# Patient Record
Sex: Female | Born: 1960 | Race: White | Hispanic: No | Marital: Married | Smoking: Never smoker
Health system: Southern US, Community
[De-identification: ages and names within clinical notes are randomized; demographics above are authoritative.]

## PROBLEM LIST (undated history)

## (undated) DIAGNOSIS — R200 Anesthesia of skin: Secondary | ICD-10-CM

## (undated) DIAGNOSIS — H539 Unspecified visual disturbance: Secondary | ICD-10-CM

## (undated) DIAGNOSIS — J45909 Unspecified asthma, uncomplicated: Secondary | ICD-10-CM

## (undated) DIAGNOSIS — M1990 Unspecified osteoarthritis, unspecified site: Secondary | ICD-10-CM

## (undated) DIAGNOSIS — F419 Anxiety disorder, unspecified: Secondary | ICD-10-CM

## (undated) DIAGNOSIS — M791 Myalgia, unspecified site: Secondary | ICD-10-CM

## (undated) DIAGNOSIS — R42 Dizziness and giddiness: Secondary | ICD-10-CM

## (undated) DIAGNOSIS — R413 Other amnesia: Secondary | ICD-10-CM

## (undated) DIAGNOSIS — M797 Fibromyalgia: Secondary | ICD-10-CM

## (undated) DIAGNOSIS — Z8619 Personal history of other infectious and parasitic diseases: Secondary | ICD-10-CM

## (undated) DIAGNOSIS — R202 Paresthesia of skin: Secondary | ICD-10-CM

## (undated) HISTORY — PX: HIP ARTHROPLASTY: SHX981

## (undated) HISTORY — DX: Unspecified osteoarthritis, unspecified site: M19.90

## (undated) HISTORY — DX: Other amnesia: R41.3

## (undated) HISTORY — DX: Dizziness and giddiness: R42

## (undated) HISTORY — DX: Anxiety disorder, unspecified: F41.9

## (undated) HISTORY — DX: Anesthesia of skin: R20.0

## (undated) HISTORY — DX: Personal history of other infectious and parasitic diseases: Z86.19

## (undated) HISTORY — DX: Fibromyalgia: M79.7

## (undated) HISTORY — DX: Unspecified visual disturbance: H53.9

## (undated) HISTORY — DX: Paresthesia of skin: R20.2

## (undated) HISTORY — DX: Unspecified asthma, uncomplicated: J45.909

## (undated) HISTORY — PX: ABDOMINAL HYSTERECTOMY: SHX81

## (undated) HISTORY — DX: Myalgia, unspecified site: M79.10

---

## 2005-04-25 ENCOUNTER — Ambulatory Visit: Payer: Self-pay | Admitting: Obstetrics and Gynecology

## 2006-05-18 ENCOUNTER — Ambulatory Visit: Payer: Self-pay | Admitting: Obstetrics and Gynecology

## 2006-11-16 ENCOUNTER — Ambulatory Visit: Payer: Self-pay | Admitting: Gastroenterology

## 2007-01-18 ENCOUNTER — Ambulatory Visit: Payer: Self-pay | Admitting: Rheumatology

## 2007-05-21 ENCOUNTER — Ambulatory Visit: Payer: Self-pay | Admitting: Obstetrics and Gynecology

## 2008-05-22 ENCOUNTER — Ambulatory Visit: Payer: Self-pay | Admitting: Obstetrics and Gynecology

## 2009-04-30 ENCOUNTER — Ambulatory Visit: Payer: Self-pay | Admitting: Family Medicine

## 2009-05-25 ENCOUNTER — Ambulatory Visit: Payer: Self-pay | Admitting: Obstetrics and Gynecology

## 2010-05-27 ENCOUNTER — Ambulatory Visit: Payer: Self-pay | Admitting: Obstetrics and Gynecology

## 2010-06-02 ENCOUNTER — Ambulatory Visit: Payer: Self-pay | Admitting: Obstetrics and Gynecology

## 2011-05-30 ENCOUNTER — Ambulatory Visit: Payer: Self-pay | Admitting: Obstetrics and Gynecology

## 2012-01-17 ENCOUNTER — Ambulatory Visit: Payer: Self-pay | Admitting: Family Medicine

## 2012-05-18 ENCOUNTER — Ambulatory Visit: Payer: Self-pay | Admitting: Gastroenterology

## 2012-06-01 ENCOUNTER — Ambulatory Visit: Payer: Self-pay | Admitting: Obstetrics and Gynecology

## 2012-06-18 ENCOUNTER — Ambulatory Visit: Payer: Self-pay | Admitting: Obstetrics and Gynecology

## 2012-10-24 ENCOUNTER — Ambulatory Visit: Payer: Self-pay | Admitting: Orthopedic Surgery

## 2012-11-05 ENCOUNTER — Ambulatory Visit: Payer: Self-pay | Admitting: Otolaryngology

## 2012-12-18 ENCOUNTER — Ambulatory Visit: Payer: Self-pay | Admitting: Obstetrics and Gynecology

## 2013-06-13 ENCOUNTER — Ambulatory Visit: Payer: Self-pay | Admitting: Obstetrics and Gynecology

## 2014-06-06 ENCOUNTER — Ambulatory Visit: Payer: Self-pay | Admitting: Orthopedic Surgery

## 2014-06-26 ENCOUNTER — Ambulatory Visit: Payer: Self-pay | Admitting: Obstetrics and Gynecology

## 2014-07-11 HISTORY — PX: COLONOSCOPY: SHX174

## 2014-10-17 ENCOUNTER — Ambulatory Visit
Admit: 2014-10-17 | Disposition: A | Discharge status: Home / Self Care | Payer: Self-pay | Attending: Family Medicine | Admitting: Family Medicine

## 2015-06-09 ENCOUNTER — Other Ambulatory Visit: Payer: Self-pay | Admitting: Obstetrics and Gynecology

## 2015-06-09 DIAGNOSIS — N644 Mastodynia: Secondary | ICD-10-CM

## 2015-06-24 ENCOUNTER — Other Ambulatory Visit: Payer: Self-pay

## 2015-06-24 ENCOUNTER — Ambulatory Visit: Payer: Self-pay

## 2016-02-28 IMAGING — MR MRI LUMBAR SPINE WITHOUT AND WITH CONTRAST
4 of 7 series · 13 of 48 positions shown · IV contrast (multihance)
Comparison: None.

CLINICAL DATA: Low back pain with pain and numbness in the legs.

EXAM:
MRI LUMBAR SPINE WITHOUT AND WITH CONTRAST
TECHNIQUE: Multiplanar and multiecho pulse sequences of the lumbar spine were
obtained without and with intravenous contrast.
CONTRAST:  15 cc MultiHance

[Series 2: T2 · sagittal · 4.0mm · 0.36mm/px · 4 of 16 slices shown (1 of 2)]
[im 1/16]
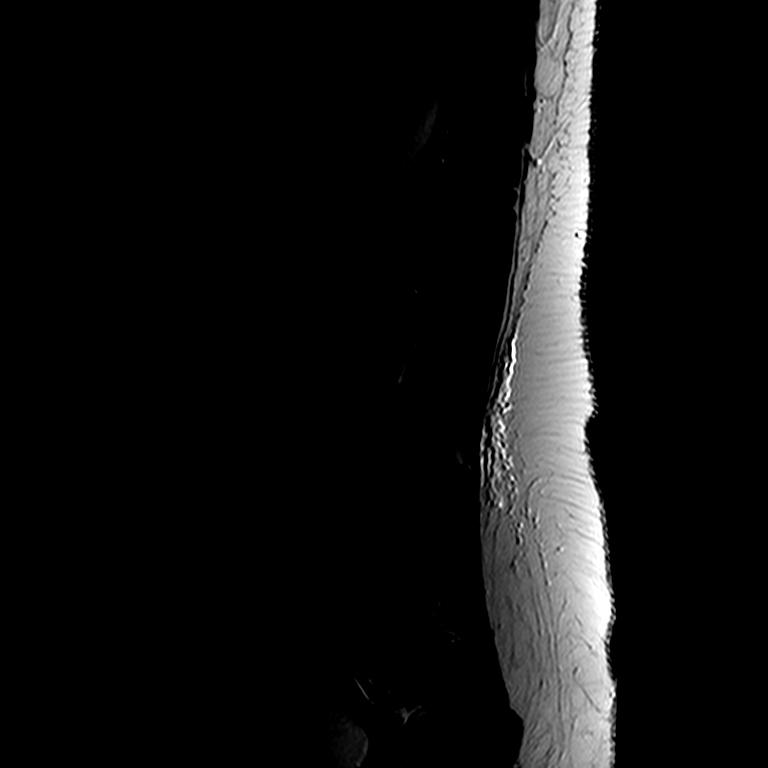
[im 4/16]
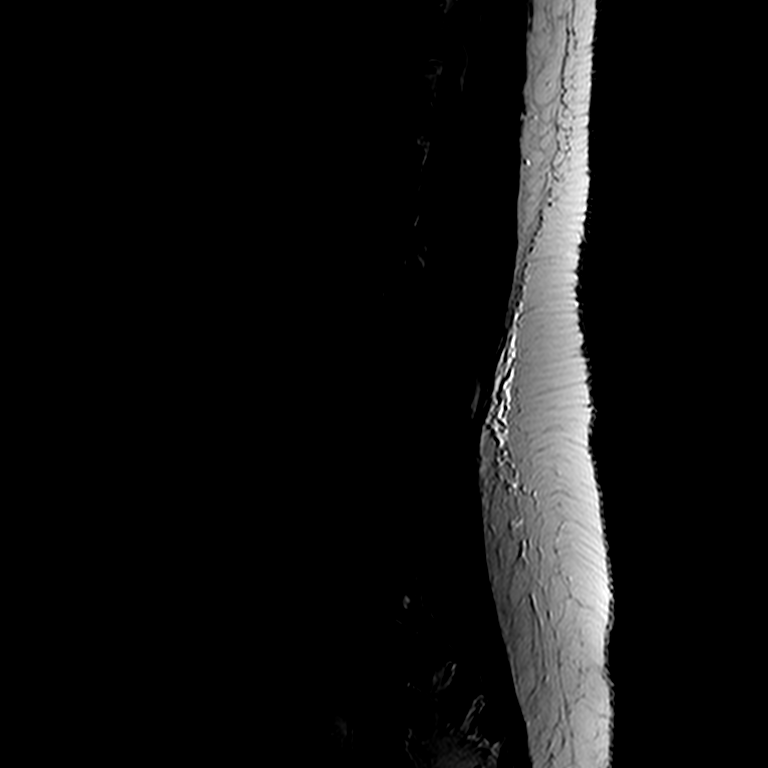
[im 8/16]
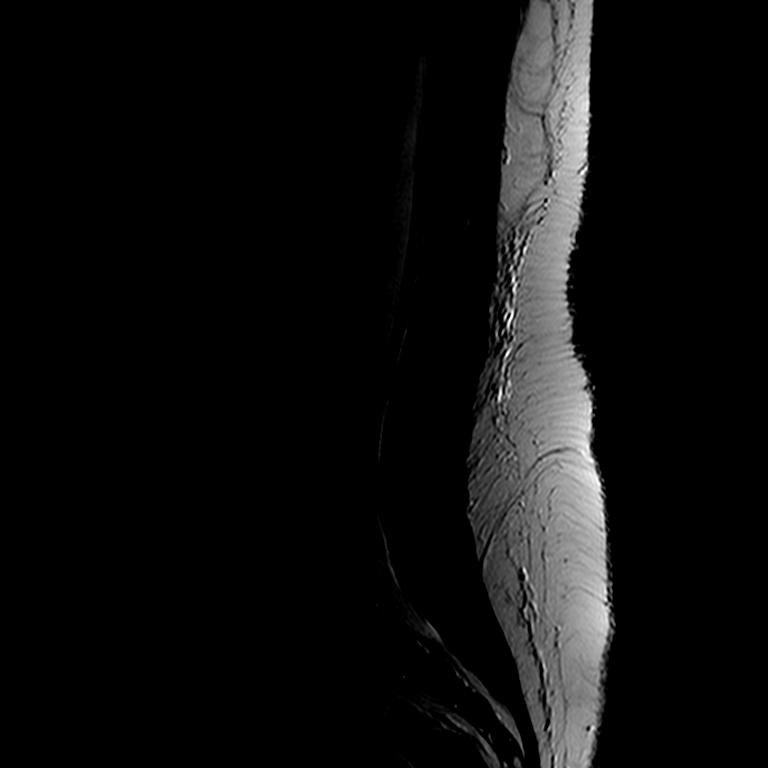
[im 16/16]
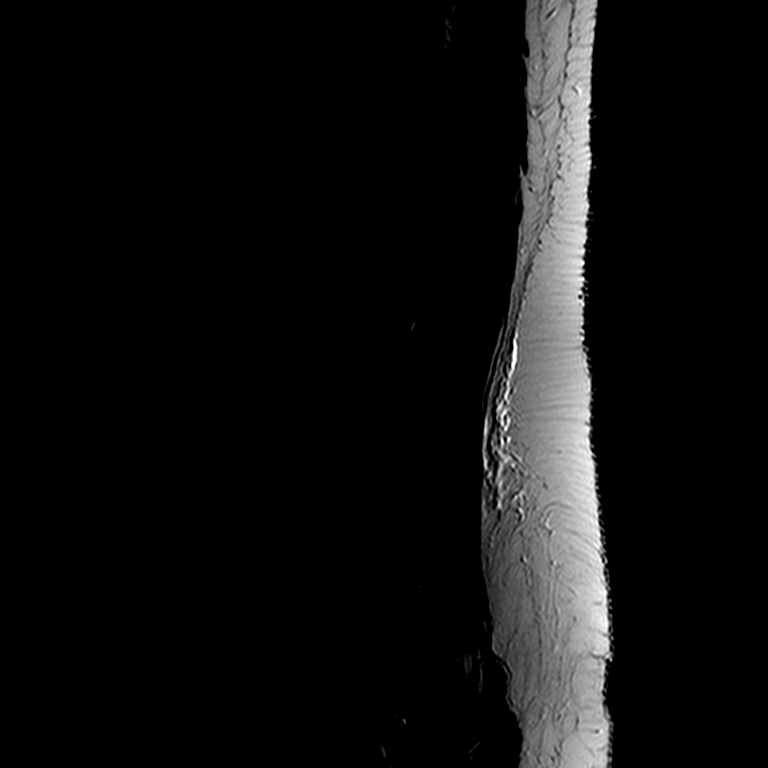

[Series 3: T1 · sagittal · 4.0mm · 0.44mm/px · 3 of 16 slices shown (1 of 2)]
[im 1/16]
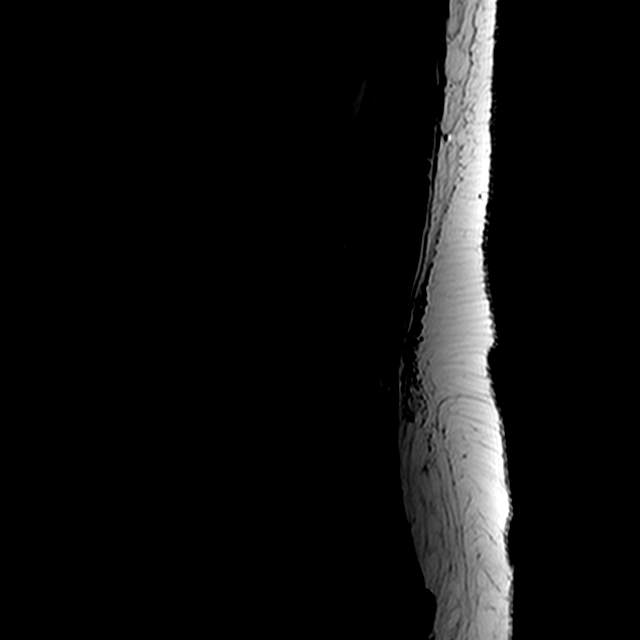
[im 8/16]
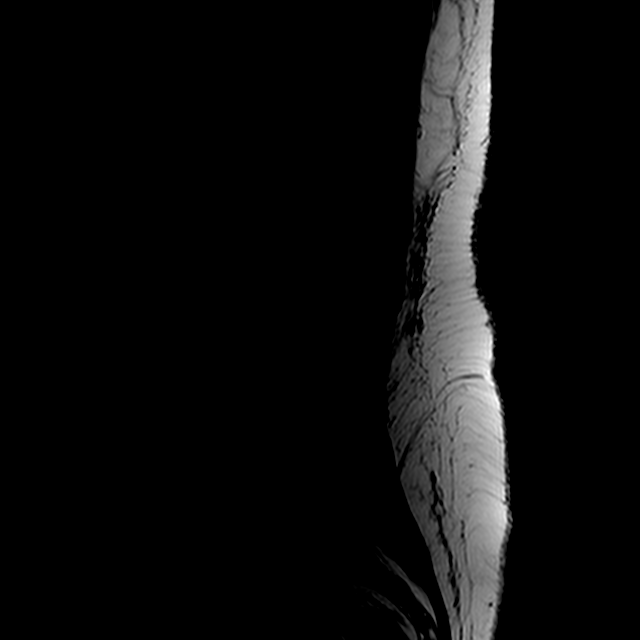
[im 16/16]
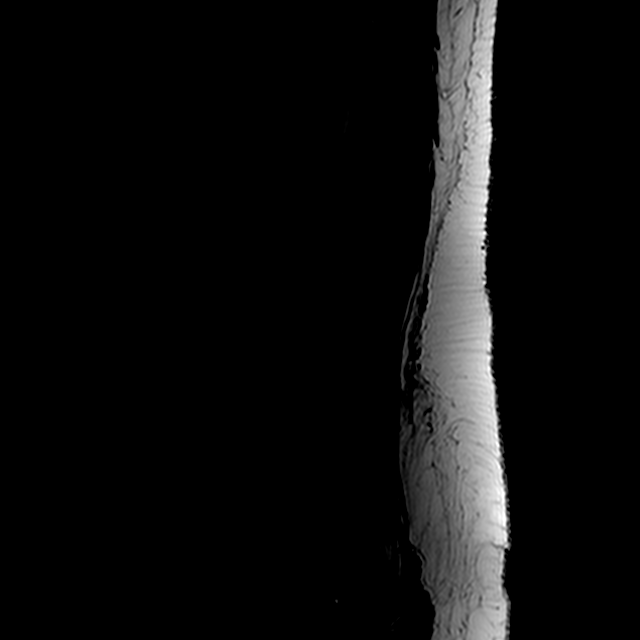

[Series 5: T2 · axial · 4.0mm · 0.39mm/px · z∈[-110,+54]mm · 3 of 37 slices shown (2 of 2)]
[im 5/37]
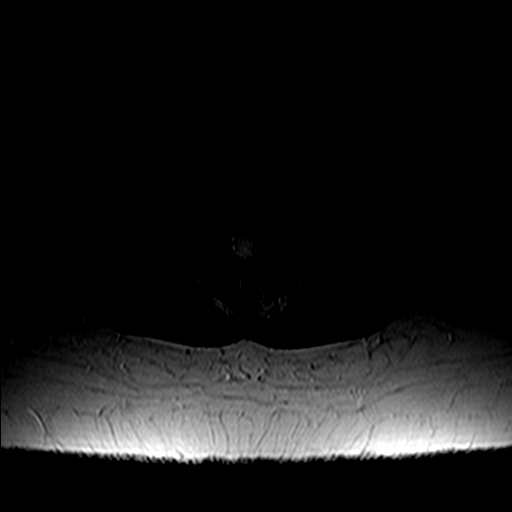
[im 21/37]
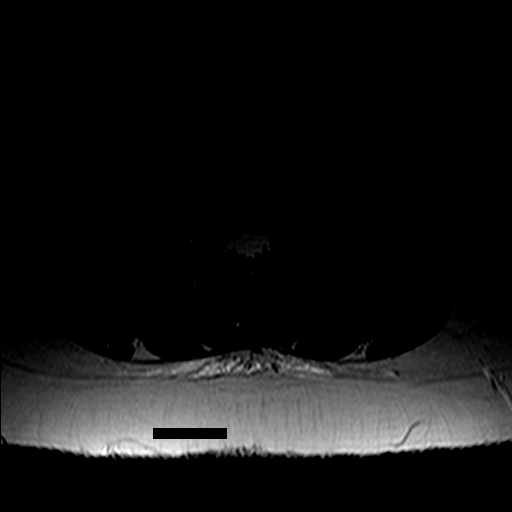
[im 33/37]
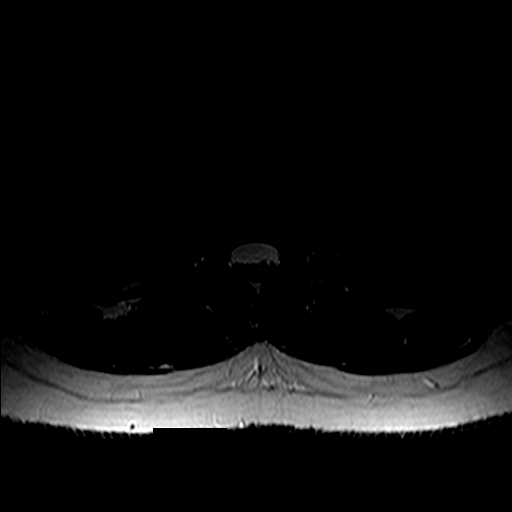

[Series 6: T1 · axial · 4.0mm · 0.39mm/px · z∈[-110,+54]mm · 3 of 37 slices shown (2 of 2)]
[im 5/37]
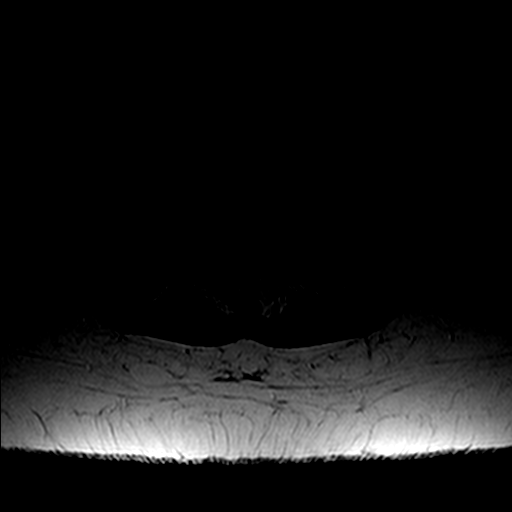
[im 21/37]
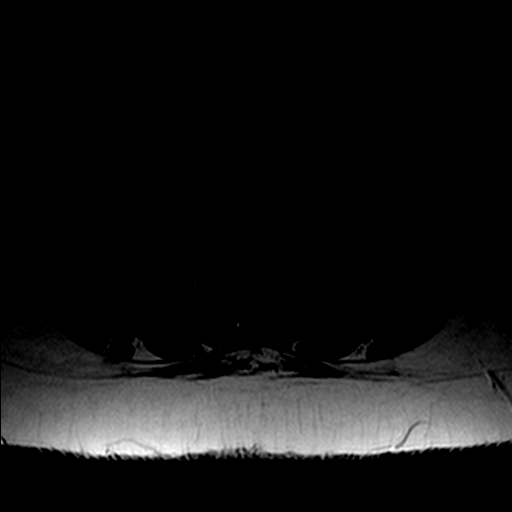
[im 33/37]
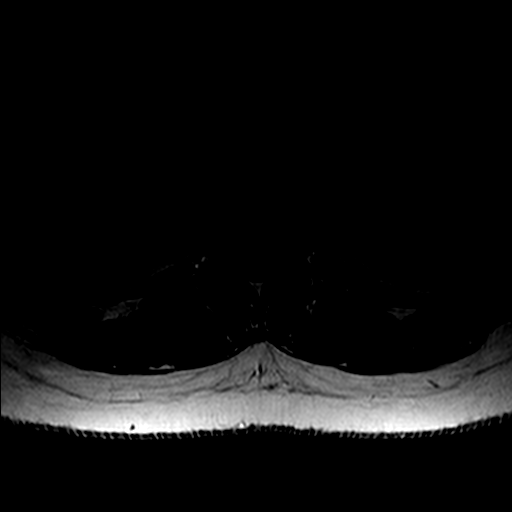

[13 of 48 positions shown; findings below may reference images not displayed]

FINDINGS: Normal conus tip at T12-L1. Paraspinal soft tissues are normal.
There is a 3.9 cm simple appearing cyst on the right ovary as well
as several small follicles.

The T11-12 and T12-L1:  Normal.

L1-2 and L2-3: Tiny broad-based disc bulges with no neural
impingement.

L3-4: Normal disc. Minimal degenerative changes of the right facet
joint.

L4-5: Normal disc. Moderate bilateral facet arthritis with
inflammation in and around both joints. The soft tissues enhance
around both facet joints after contrast administration.

L5-S1: Tiny annular fissure to the right of midline. Otherwise,
normal.
IMPRESSION: Inflammatory arthropathy of both facet joints at L4-5. Otherwise,
essentially normal exam.

## 2016-04-20 ENCOUNTER — Other Ambulatory Visit: Payer: Self-pay | Admitting: Orthopedic Surgery

## 2016-04-20 DIAGNOSIS — S63501D Unspecified sprain of right wrist, subsequent encounter: Secondary | ICD-10-CM

## 2016-05-03 ENCOUNTER — Ambulatory Visit
Admission: RE | Admit: 2016-05-03 | Discharge: 2016-05-03 | Disposition: A | Discharge status: Home / Self Care | Payer: BLUE CROSS/BLUE SHIELD | Source: Ambulatory Visit | Attending: Orthopedic Surgery | Admitting: Orthopedic Surgery

## 2016-05-03 ENCOUNTER — Ambulatory Visit: Payer: Self-pay

## 2016-05-03 DIAGNOSIS — S63501D Unspecified sprain of right wrist, subsequent encounter: Secondary | ICD-10-CM

## 2016-12-14 ENCOUNTER — Encounter: Payer: Self-pay | Admitting: Hematology and Oncology

## 2016-12-14 ENCOUNTER — Inpatient Hospital Stay: Payer: BLUE CROSS/BLUE SHIELD | Attending: Hematology and Oncology | Admitting: Hematology and Oncology

## 2016-12-14 VITALS — BP 132/73 | HR 72 | Temp 98.2°F | Resp 18 | Wt 182.3 lb

## 2016-12-14 DIAGNOSIS — Z79899 Other long term (current) drug therapy: Secondary | ICD-10-CM

## 2016-12-14 DIAGNOSIS — Z809 Family history of malignant neoplasm, unspecified: Secondary | ICD-10-CM | POA: Insufficient documentation

## 2016-12-14 DIAGNOSIS — R5383 Other fatigue: Secondary | ICD-10-CM | POA: Diagnosis not present

## 2016-12-14 DIAGNOSIS — D472 Monoclonal gammopathy: Secondary | ICD-10-CM | POA: Insufficient documentation

## 2016-12-14 DIAGNOSIS — R42 Dizziness and giddiness: Secondary | ICD-10-CM | POA: Diagnosis not present

## 2016-12-14 DIAGNOSIS — L659 Nonscarring hair loss, unspecified: Secondary | ICD-10-CM | POA: Insufficient documentation

## 2016-12-14 DIAGNOSIS — R778 Other specified abnormalities of plasma proteins: Secondary | ICD-10-CM | POA: Insufficient documentation

## 2016-12-14 DIAGNOSIS — M797 Fibromyalgia: Secondary | ICD-10-CM | POA: Insufficient documentation

## 2016-12-14 DIAGNOSIS — R110 Nausea: Secondary | ICD-10-CM | POA: Diagnosis not present

## 2016-12-14 DIAGNOSIS — R202 Paresthesia of skin: Secondary | ICD-10-CM | POA: Insufficient documentation

## 2016-12-14 DIAGNOSIS — H538 Other visual disturbances: Secondary | ICD-10-CM | POA: Insufficient documentation

## 2016-12-14 LAB — CBC WITH DIFFERENTIAL/PLATELET
Basophils Absolute: 0.1 10*3/uL (ref 0–0.1)
Basophils Relative: 1 %
Eosinophils Absolute: 0.2 10*3/uL (ref 0–0.7)
Eosinophils Relative: 2 %
HCT: 44.3 % (ref 35.0–47.0)
Hemoglobin: 14.9 g/dL (ref 12.0–16.0)
Lymphocytes Relative: 31 %
Lymphs Abs: 3 10*3/uL (ref 1.0–3.6)
MCH: 30.3 pg (ref 26.0–34.0)
MCHC: 33.6 g/dL (ref 32.0–36.0)
MCV: 90.3 fL (ref 80.0–100.0)
Monocytes Absolute: 0.7 10*3/uL (ref 0.2–0.9)
Monocytes Relative: 7 %
Neutro Abs: 5.6 10*3/uL (ref 1.4–6.5)
Neutrophils Relative %: 59 %
Platelets: 254 10*3/uL (ref 150–440)
RBC: 4.9 MIL/uL (ref 3.80–5.20)
RDW: 14.4 % (ref 11.5–14.5)
WBC: 9.5 10*3/uL (ref 3.6–11.0)

## 2016-12-14 NOTE — Progress Notes (Signed)
[facility] day: 12/14/2016  Chief Complaint: Jeanne Anderson is a 56 y.o. female with an abnormal SPEP who is referred in consultation by Dr. Hortencia Pilar for assessment and management.  HPI:  The patient was seen by Dr. Annamaria Helling, neurologist at [facility] for a constellation of symptoms on 11/28/2016.  She is being evaluated for multiple sclerosis.  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  She describes a workup years ago at an [facility]. Workup was negative. She describes symptoms of tingling in her face and arms. She has deeper pain in her thighs and arms.   She has extreme fatigue. She can "only do one thing".  She notes some nausea and vertigo.  She states that her right eye is out of focus.  She notes 2 years ago having a head MRI and C-spine. She notes no prior lumbar puncture.  She denies any exposure to radiation or toxins. She has been unable to work since 04/2016. She denies any fevers, sweats or weight loss.   She describes being cold and describes her "marrow freezing". Thyroid function tests have been "okay". Colonoscopy and mammogram (07/12/2016) are up-to-date.  Head MRI on 12/06/2016 revealed no specific findings of multiple sclerosis. There were not multiple small nonspecific scattered foci of increased T2 weighted signal within the subcortical and deep white matter likely indicating chronic microvascular ischemic changes or sequela due to migraine headaches.  CBC on 11/18/2016 revealed a hematocrit of 44.6, hemoglobin 15, MCV 91, platelets 283,000, white count 9200, and a normal differential.  Comprehensive metabolic panel was normal with a creatinine of 0.9.  SPEP on 11/18/2016 revealed a 0.1 gm/dL IgM lambda monoclonal protein.  B12 was 993 on 11/18/2016.  TSH was 1.71 on 11/18/2016.  Hepatitis C testing and HIV testing were negative on 08/29/2016.  Folate was 7.2 on 09/08/2008.  ANA was negative on 02/10/2015.  Rheumatoid factor  was negative on 12/13/2006.   Past Medical History:  Diagnosis Date  . Anxiety   . Arthritis   . Asthma   . Dizziness   . Fibromyalgia   . History of Epstein-Barr virus infection   . Memory changes   . Muscular pain   . Numbness and tingling   . Vision changes     Past Surgical History:  Procedure Laterality Date  . ABDOMINAL HYSTERECTOMY    . COLONOSCOPY  2016    Family History  Problem Relation Age of Onset  . Cancer Mother   . Cancer Sister     Social History:  reports that she has never smoked. She does not have any smokeless tobacco history on file. She reports that she drinks alcohol. She reports that she does not use drugs.  She denies any exposure to radiation or toxins.  She is a [employer].  She lives in [place].  The patient is alone today.  Allergies:  Allergies  Allergen Reactions  . Lidocaine     Other reaction(s): Unknown Weird things go numb per pt.   . Meloxicam     Other reaction(s): Other (See Comments) Other Reaction: OTHER REACTION    Current Medications: Current Outpatient Prescriptions  Medication Sig Dispense Refill  . albuterol (PROVENTIL) (2.5 MG/3ML) 0.083% nebulizer solution Inhale into the lungs.    . ALPRAZolam (XANAX) 0.25 MG tablet Take 0.25 mg by mouth as needed.    . Budesonide (PULMICORT FLEXHALER IN) Inhale into the lungs.    . Cholecalciferol (VITAMIN D3) 5000 units CAPS  Take 5,000 Units by mouth daily.    Marland Kitchen ibuprofen (ADVIL,MOTRIN) 800 MG tablet Take 800 mg by mouth as needed.    Marland Kitchen PULMICORT FLEXHALER 180 MCG/ACT inhaler INHALE 1 INHALATION INTO THE LUNGS 2 (TWO) TIMES DAILY.  3  . triamcinolone (NASACORT) 55 MCG/ACT AERO nasal inhaler Place 1 spray into the nose daily.    . vitamin B-12 (CYANOCOBALAMIN) 1000 MCG tablet Take 1,000 mcg by mouth daily.     No current facility-administered medications for this visit.     Review of Systems:  GENERAL:  Extreme fatigue.  Feels cold ("marrow freezing").  No fevers, sweats or  weight loss. PERFORMANCE STATUS (ECOG):  1 HEENT:  Right eye "out of focus".  Norunny nose, sore throat, mouth sores or tenderness. Lungs:  Shortness of breath due to asthma.  No cough.  No hemoptysis. Cardiac:  No chest pain, palpitations, orthopnea, or PND. GI:  Nausea.  Novomiting, diarrhea, constipation, melena or hematochezia.  Colonoscopy up-to-date. GU:  No urgency, frequency, dysuria, or hematuria. Musculoskeletal:  No back pain.  Arthritis.  No muscle tenderness. Extremities:  No pain or swelling. Skin:  No rashes or skin changes. Neuro:  Difficulty concentrating.  Tingling in face and arms.  Vertigo.  Migraine headaches.  No numbness or weakness, balance or coordination issues. Endocrine:  No diabetes, thyroid issues, hot flashes or night sweats. Psych:  No mood changes, depression or anxiety. Pain:  No focal pain. Review of systems:  All other systems reviewed and found to be negative.  Physical Exam: Blood pressure 132/73, pulse 72, temperature 98.2 F (36.8 C), temperature source Tympanic, resp. rate 18, weight 182 lb 5.1 oz (82.7 kg). GENERAL:  Well developed, well nourished, woman sitting comfortably in the exam room in no acute distress. MENTAL STATUS:  Alert and oriented to person, place and time. HEAD:  Shoulder length brown hair.  Normocephalic, atraumatic, face symmetric, no Cushingoid features. EYES:  Glasses.  Brown eyes.  Pupils equal round and reactive to light and accomodation.  No conjunctivitis or scleral icterus. ENT:  Oropharynx clear without lesion.  Tongue normal. Mucous membranes moist.  RESPIRATORY:  Clear to auscultation without rales, wheezes or rhonchi. CARDIOVASCULAR:  Regular rate and rhythm without murmur, rub or gallop. ABDOMEN:  Soft, non-tender, with active bowel sounds, and no hepatosplenomegaly.  No masses. SKIN:  No rashes, ulcers or lesions. EXTREMITIES: No edema, no skin discoloration or tenderness.  No palpable cords. LYMPH NODES: No  palpable cervical, supraclavicular, axillary or inguinal adenopathy  NEUROLOGICAL: Unremarkable. PSYCH:  Appropriate.   Office Visit on 12/14/2016  Component Date Value Ref Range Status  . WBC 12/14/2016 9.5  3.6 - 11.0 K/uL Final  . RBC 12/14/2016 4.90  3.80 - 5.20 MIL/uL Final  . Hemoglobin 12/14/2016 14.9  12.0 - 16.0 g/dL Final  . HCT 12/14/2016 44.3  35.0 - 47.0 % Final  . MCV 12/14/2016 90.3  80.0 - 100.0 fL Final  . MCH 12/14/2016 30.3  26.0 - 34.0 pg Final  . MCHC 12/14/2016 33.6  32.0 - 36.0 g/dL Final  . RDW 12/14/2016 14.4  11.5 - 14.5 % Final  . Platelets 12/14/2016 254  150 - 440 K/uL Final  . Neutrophils Relative % 12/14/2016 59  % Final  . Neutro Abs 12/14/2016 5.6  1.4 - 6.5 K/uL Final  . Lymphocytes Relative 12/14/2016 31  % Final  . Lymphs Abs 12/14/2016 3.0  1.0 - 3.6 K/uL Final  . Monocytes Relative 12/14/2016 7  % Final  .  Monocytes Absolute 12/14/2016 0.7  0.2 - 0.9 K/uL Final  . Eosinophils Relative 12/14/2016 2  % Final  . Eosinophils Absolute 12/14/2016 0.2  0 - 0.7 K/uL Final  . Basophils Relative 12/14/2016 1  % Final  . Basophils Absolute 12/14/2016 0.1  0 - 0.1 K/uL Final  . IgG (Immunoglobin G), Serum 12/14/2016 677* 700 - 1,600 mg/dL Final  . IgA 12/14/2016 72* 87 - 352 mg/dL Final  . IgM, Serum 12/14/2016 228* 26 - 217 mg/dL Final  . Total Protein ELP 12/14/2016 7.0  6.0 - 8.5 g/dL Corrected  . Albumin SerPl Elph-Mcnc 12/14/2016 3.9  2.9 - 4.4 g/dL Corrected  . Alpha 1 12/14/2016 0.2  0.0 - 0.4 g/dL Corrected  . Alpha2 Glob SerPl Elph-Mcnc 12/14/2016 0.8  0.4 - 1.0 g/dL Corrected  . B-Globulin SerPl Elph-Mcnc 12/14/2016 1.1  0.7 - 1.3 g/dL Corrected  . Gamma Glob SerPl Elph-Mcnc 12/14/2016 0.9  0.4 - 1.8 g/dL Corrected  . M Protein SerPl Elph-Mcnc 12/14/2016 0.2* Not Observed g/dL Corrected  . Globulin, Total 12/14/2016 3.1  2.2 - 3.9 g/dL Corrected  . Albumin/Glob SerPl 12/14/2016 1.3  0.7 - 1.7 Corrected  . IFE 1 12/14/2016 Comment   Corrected    Comment: (NOTE) Immunofixation shows IgM monoclonal protein with lambda light chain specificity.   . Please Note 12/14/2016 Comment   Corrected   Comment: (NOTE) Protein electrophoresis scan will follow via computer, mail, or courier delivery. Performed At: [facility] [place], [place] [id number] Lindon Romp MD OX:[id number]   . Kappa free light chain 12/14/2016 7.4  3.3 - 19.4 mg/L Final  . Lamda free light chains 12/14/2016 14.6  5.7 - 26.3 mg/L Final  . Kappa, lamda light chain ratio 12/14/2016 0.51  0.26 - 1.65 Final   Comment: (NOTE) Performed At: [facility] [place], [place] [id number] Lindon Romp MD WL:[id number]     Assessment:  Jeanne Anderson is a 56 y.o. female with a small monoclonal protein detected in 11/2016.  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  Head MRI on 12/06/2016 revealed no specific findings of multiple sclerosis. There were not multiple small nonspecific scattered foci of increased T2 weighted signal within the subcortical and deep white matter likely indicating chronic microvascular ischemic changes or sequela due to migraine headaches.  CBC on 11/18/2016 revealed a hematocrit of 44.6, hemoglobin 15, MCV 91, platelets 283,000, white count 9200, and a normal differential.  SPEP revealed a 0.1 gm/dL IgM lambda monoclonal protein.  Normal studies included: B12, TSH.  Hepatitis C testing and HIV testing were negative on 08/29/2016.  ANA was negative on 02/10/2015.  Rheumatoid factor was negative on 12/13/2006.  Symptomatically, she denies any B symptoms.  She denies any issues with infections.  Exam is unremarkable.  Plan: 1.  Discuss current symptomatology and very small monoclonal spike (0.1 gm/dL).  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  Discuss likely unrelated to current symptomatology, but need for follow-up every  6 months secondary to risk of transformation to a lymphoproliferative disorder (1%/year).  Discuss work-up. 2.  Labs today:  CBC with diff, SPEP, immunoglobulins, free light chain assay, paraneoplastic panel. 3.  24 hour urine for UPEP and free light chains. 4.  RTC in 2 weeks for MD assessment and review of testing.   Lequita Asal, MD  12/14/2016, 12:36 PM

## 2016-12-14 NOTE — Progress Notes (Signed)
Patient here today as new evaluation regarding abnormal SPEP.  Referred by Dr. Hoy Morn.

## 2016-12-15 ENCOUNTER — Encounter: Payer: Self-pay | Admitting: Family Medicine

## 2016-12-15 LAB — KAPPA/LAMBDA LIGHT CHAINS
Kappa free light chain: 7.4 mg/L (ref 3.3–19.4)
Kappa, lambda light chain ratio: 0.51 (ref 0.26–1.65)
Lambda free light chains: 14.6 mg/L (ref 5.7–26.3)

## 2016-12-16 ENCOUNTER — Inpatient Hospital Stay: Payer: BLUE CROSS/BLUE SHIELD

## 2016-12-16 DIAGNOSIS — R778 Other specified abnormalities of plasma proteins: Secondary | ICD-10-CM

## 2016-12-16 DIAGNOSIS — D472 Monoclonal gammopathy: Secondary | ICD-10-CM | POA: Diagnosis not present

## 2016-12-16 LAB — MULTIPLE MYELOMA PANEL, SERUM
Albumin SerPl Elph-Mcnc: 3.9 g/dL (ref 2.9–4.4)
Albumin/Glob SerPl: 1.3 (ref 0.7–1.7)
Alpha 1: 0.2 g/dL (ref 0.0–0.4)
Alpha2 Glob SerPl Elph-Mcnc: 0.8 g/dL (ref 0.4–1.0)
B-Globulin SerPl Elph-Mcnc: 1.1 g/dL (ref 0.7–1.3)
Gamma Glob SerPl Elph-Mcnc: 0.9 g/dL (ref 0.4–1.8)
Globulin, Total: 3.1 g/dL (ref 2.2–3.9)
IgA: 72 mg/dL — ABNORMAL LOW (ref 87–352)
IgG (Immunoglobin G), Serum: 677 mg/dL — ABNORMAL LOW (ref 700–1600)
IgM, Serum: 228 mg/dL — ABNORMAL HIGH (ref 26–217)
M Protein SerPl Elph-Mcnc: 0.2 g/dL — ABNORMAL HIGH
Total Protein ELP: 7 g/dL (ref 6.0–8.5)

## 2016-12-18 ENCOUNTER — Encounter: Payer: Self-pay | Admitting: Hematology and Oncology

## 2016-12-19 LAB — IFE+PROTEIN ELECTRO, 24-HR UR
% BETA, Urine: 0 %
ALPHA 1 URINE: 0 %
Albumin, U: 100 %
Alpha 2, Urine: 0 %
GAMMA GLOBULIN URINE: 0 %
Total Protein, Urine-Ur/day: <168 mg/24 hr — ABNORMAL HIGH (ref 30–150)
Total Protein, Urine: <4 mg/dL
Total Volume: 4200

## 2016-12-27 NOTE — Progress Notes (Signed)
[facility] day: 12/28/2016  Chief Complaint: Jeanne Anderson is a 56 y.o. female with an abnormal SPEP who is seen for review of work-up and discussion regarding direction of therapy.  HPI:  The patient was last seen in the hematology clinic on 12/14/2016.  At that time, she was seen for initial consultation.  She had a small monoclonal protein detected in 11/2016.  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  She denied any B symptoms, issues with infections or bone pain.  Exam was unremarkable.  She underwent a work-up.  CBC revealed a hematocrit of 44.3, hemoglobin 14.9, MCV 90.3, platelets 254,000, white count 9500 with an ANC of 5600.  SPEP revealed a 0.2 g/dL IgM monoclonal protein with lambda light chain specificity.  IgG was 677 (low), IgA 72 (low), and IgM 228 (26-217).  Kappa free light chains were 7.4, lambda free light chains 14.6 with ratio of 0.51 (normal).  24-hour urine protein was less than 4.0 mg/dL. There was no monoclonal protein.  Immunofixation was normal.  Creatinine was 0.9, calcium 9.5, albumen 4.6, and protein 7.3 on 11/18/2016.  During the interim, patient has been seen by neurology. She reports that the neurologist does not want to make a Dx, or develop a further plan, until the workup is completed by this office. Patient concerned with losing her hair; "I have half of the hair that I had 8 months ago."  She has a strong family history of thyroid disorder. Patient recently started on Gapapentin 300mg  BID by neurology. She reports symptoms of vertigo with "nystagmus" since starting this medication; neurologist aware.    Past Medical History:  Diagnosis Date  . Anxiety   . Arthritis   . Asthma   . Dizziness   . Fibromyalgia   . History of Epstein-Barr virus infection   . Memory changes   . Muscular pain   . Numbness and tingling   . Vision changes     Past  Surgical History:  Procedure Laterality Date  . ABDOMINAL HYSTERECTOMY    . COLONOSCOPY  2016    Family History  Problem Relation Age of Onset  . Cancer Mother   . Leukemia Sister   . Breast cancer Sister     Social History:  reports that she has never smoked. She has never used smokeless tobacco. She reports that she drinks alcohol. She reports that she does not use drugs.  She denies any exposure to radiation or toxins.  She is a [employer].  She lives in [place].  The patient is alone today.  Allergies:  Allergies  Allergen Reactions  . Lidocaine     Other reaction(s): Unknown Weird things go numb per pt.   . Meloxicam     Other reaction(s): Other (See Comments) Other Reaction: OTHER REACTION    Current Medications: Current Outpatient Prescriptions  Medication Sig Dispense Refill  . albuterol (PROVENTIL) (2.5 MG/3ML) 0.083% nebulizer solution Inhale into the lungs.    . ALPRAZolam (XANAX) 0.25 MG tablet Take 0.25 mg by mouth as needed.    . Budesonide (PULMICORT FLEXHALER IN) Inhale into the lungs.    . Cholecalciferol (VITAMIN D3) 5000 units CAPS Take 5,000 Units by mouth daily.    Marland Kitchen ibuprofen (ADVIL,MOTRIN) 800 MG tablet Take 800 mg by mouth as needed.    Marland Kitchen PULMICORT FLEXHALER 180 MCG/ACT inhaler INHALE 1 INHALATION INTO THE LUNGS 2 (TWO) TIMES DAILY.  3  .  triamcinolone (NASACORT) 55 MCG/ACT AERO nasal inhaler Place 1 spray into the nose daily.    . vitamin B-12 (CYANOCOBALAMIN) 1000 MCG tablet Take 1,000 mcg by mouth daily.    Marland Kitchen gabapentin (NEURONTIN) 300 MG capsule Take 300 mg by mouth 3 (three) times daily.  4   No current facility-administered medications for this visit.     Review of Systems:  GENERAL:  Extreme fatigue.  Feels cold ("marrow freezing").  No fevers, sweats or weight loss. PERFORMANCE STATUS (ECOG):  1 HEENT:  Right eye "out of focus".  Norunny nose, sore throat, mouth sores or tenderness. Lungs:  Shortness of breath due to asthma.  No cough.   No hemoptysis. Cardiac:  No chest pain, palpitations, orthopnea, or PND. GI:  Nausea.  No vomiting, diarrhea, constipation, melena or hematochezia.  Colonoscopy up-to-date. GU:  No urgency, frequency, dysuria, or hematuria. Musculoskeletal:  No back pain.  Arthritis.  No muscle tenderness. Extremities:  No pain or swelling. Skin:  No rashes or skin changes. Neuro:  Difficulty concentrating.  Tingling in face and arms.  (+) Vertigo.  (+) Migraine headaches.  No numbness or weakness, balance or coordination issues. Endocrine:  No diabetes, hot flashes or night sweats. (+) hair loss. (+) family history of thyroid disorders; none diagnosed in the patient.  Psych:  No mood changes, depression or anxiety. Pain:  No focal pain. Review of systems:  All other systems reviewed and found to be negative.  Physical Exam: Blood pressure 104/70, pulse 71, temperature (!) 96.8 F (36 C), temperature source Tympanic, resp. rate 18, weight 184 lb 15.5 oz (83.9 kg). GENERAL:  Well developed, well nourished, woman sitting comfortably in the exam room in no acute distress. MENTAL STATUS:  Alert and oriented to person, place and time. HEAD:  Shoulder length brown hair.  Normocephalic, atraumatic, face symmetric, no Cushingoid features. EYES:  Glasses.  Brown eyes.  No conjunctivitis or scleral icterus. ENT:  Oropharynx clear without lesion.  Tongue normal. Mucous membranes moist.  NEUROLOGICAL: Unremarkable. PSYCH:  Appropriate.   No visits with results within 3 Day(s) from this visit.  Latest known visit with results is:  Appointment on 12/16/2016  Component Date Value Ref Range Status  . Total Protein, Urine 12/16/2016 <4.0  Not Estab. mg/dL Final  . Total Protein, Urine-Ur/day 12/16/2016 <168* 30 - 150 mg/24 hr Final  . Albumin, U 12/16/2016 100.0  % Final  . ALPHA 1 URINE 12/16/2016 0.0  % Final  . Alpha 2, Urine 12/16/2016 0.0  % Final  . % BETA, Urine 12/16/2016 0.0  % Final  . GAMMA GLOBULIN  URINE 12/16/2016 0.0  % Final  . M-SPIKE %, Urine 12/16/2016 Not Observed  Not Observed % Final  . Immunofixation Result, Urine 12/16/2016 Comment   Final   An apparent normal immunofixation pattern.  . Note: 12/16/2016 Comment   Final   Comment: (NOTE) Protein electrophoresis scan will follow via computer, mail, or courier delivery. Performed At: [facility] [place], [place] [id number] Lindon Romp MD [id number]   . Total Volume 12/16/2016 4200   Final    Assessment:  Jeanne Anderson is a 56 y.o. female with a small monoclonal protein detected in 11/2016.  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  Head MRI on 12/06/2016 revealed no specific findings of multiple sclerosis. There were not multiple small nonspecific scattered foci of increased T2 weighted signal within the subcortical and deep white matter likely  indicating chronic microvascular ischemic changes or sequela due to migraine headaches.  CBC on 11/18/2016 revealed a hematocrit of 44.6, hemoglobin 15, MCV 91, platelets 283,000, white count 9200, and a normal differential.  SPEP revealed a 0.1 gm/dL IgM lambda monoclonal protein.  Normal studies included: B12, TSH.  Hepatitis C testing and HIV testing were negative on 08/29/2016.  ANA was negative on 02/10/2015.  Rheumatoid factor was negative on 12/13/2006.  Symptomatically, she continues to have neurologic symptoms.  She notes hair loss and muscle pain  Exam is unremarkable.  Plan: 1.  Discuss work-up to date.  Small monoclonal protein (0.2 gm/dL).  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  Discuss normal hemoglobin, creatinine, calcium, and free light chain ratio.  Monoclonal protein IgM with low serum immunoglobulins.  MGUS typically with other normal  immunoglobulins.  Consider bone marrow and PET scan.  Discuss likelihood of bone marrow with > 10% plasma cells is < 1%.   Differential includes Waldenstrom's macroglobulinemia and IgM myeloma.  No current evidence of amyloid.  Check serum viscosity, LDH, beta-2 microglobulin, and CRP.  Given neurologic symptoms, would pursue complete work-up despite small monoclonal protein.  Discussed with patient.  Patient declined bone marrow at this time.  She is considering fat pad biopsy to r/o amyloid. 2.  Labs today:  paraneoplastic panel, serum viscosity, LDH, beta-2 microglobulin, TSH. 3.  PET scan. 4.  RTC in 3 weeks for MD assessment and review of testing.   Lequita Asal, MD  12/28/2016, 8:54 AM

## 2016-12-28 ENCOUNTER — Inpatient Hospital Stay: Payer: BLUE CROSS/BLUE SHIELD

## 2016-12-28 ENCOUNTER — Inpatient Hospital Stay (HOSPITAL_BASED_OUTPATIENT_CLINIC_OR_DEPARTMENT_OTHER): Payer: BLUE CROSS/BLUE SHIELD | Admitting: Hematology and Oncology

## 2016-12-28 ENCOUNTER — Encounter: Payer: Self-pay | Admitting: Hematology and Oncology

## 2016-12-28 VITALS — BP 104/70 | HR 71 | Temp 96.8°F | Resp 18 | Wt 185.0 lb

## 2016-12-28 DIAGNOSIS — Z809 Family history of malignant neoplasm, unspecified: Secondary | ICD-10-CM

## 2016-12-28 DIAGNOSIS — L659 Nonscarring hair loss, unspecified: Secondary | ICD-10-CM

## 2016-12-28 DIAGNOSIS — D472 Monoclonal gammopathy: Secondary | ICD-10-CM

## 2016-12-28 DIAGNOSIS — H538 Other visual disturbances: Secondary | ICD-10-CM

## 2016-12-28 DIAGNOSIS — R5383 Other fatigue: Secondary | ICD-10-CM

## 2016-12-28 DIAGNOSIS — Z79899 Other long term (current) drug therapy: Secondary | ICD-10-CM | POA: Diagnosis not present

## 2016-12-28 DIAGNOSIS — R202 Paresthesia of skin: Secondary | ICD-10-CM

## 2016-12-28 DIAGNOSIS — R42 Dizziness and giddiness: Secondary | ICD-10-CM | POA: Diagnosis not present

## 2016-12-28 DIAGNOSIS — R110 Nausea: Secondary | ICD-10-CM

## 2016-12-28 DIAGNOSIS — M797 Fibromyalgia: Secondary | ICD-10-CM

## 2016-12-28 LAB — LACTATE DEHYDROGENASE: LDH: 151 U/L (ref 98–192)

## 2016-12-28 LAB — TSH: TSH: 1.084 u[IU]/mL (ref 0.350–4.500)

## 2016-12-28 NOTE — Progress Notes (Signed)
Patient offers no complaints today. 

## 2016-12-29 LAB — VISCOSITY, SERUM: Viscosity, Serum: 1.7 rel.saline (ref 1.6–1.9)

## 2016-12-29 LAB — MISC LABCORP TEST (SEND OUT): Labcorp test code: 808966

## 2016-12-29 LAB — BETA 2 MICROGLOBULIN, SERUM: Beta-2 Microglobulin: 1.5 mg/L (ref 0.6–2.4)

## 2017-01-12 ENCOUNTER — Ambulatory Visit
Admission: RE | Admit: 2017-01-12 | Discharge: 2017-01-12 | Disposition: A | Discharge status: Home / Self Care | Payer: BLUE CROSS/BLUE SHIELD | Source: Ambulatory Visit | Attending: Hematology and Oncology | Admitting: Hematology and Oncology

## 2017-01-12 DIAGNOSIS — Z90710 Acquired absence of both cervix and uterus: Secondary | ICD-10-CM | POA: Insufficient documentation

## 2017-01-12 DIAGNOSIS — I2510 Atherosclerotic heart disease of native coronary artery without angina pectoris: Secondary | ICD-10-CM | POA: Insufficient documentation

## 2017-01-12 DIAGNOSIS — D472 Monoclonal gammopathy: Secondary | ICD-10-CM | POA: Diagnosis present

## 2017-01-12 DIAGNOSIS — K760 Fatty (change of) liver, not elsewhere classified: Secondary | ICD-10-CM | POA: Insufficient documentation

## 2017-01-12 DIAGNOSIS — I700 Atherosclerosis of aorta: Secondary | ICD-10-CM | POA: Diagnosis not present

## 2017-01-12 LAB — GLUCOSE, CAPILLARY: Glucose-Capillary: 114 mg/dL — ABNORMAL HIGH (ref 65–99)

## 2017-01-12 MED ORDER — FLUDEOXYGLUCOSE F - 18 (FDG) INJECTION
12.0000 | Freq: Once | INTRAVENOUS | Status: AC | PRN
  Administered 2017-01-12: 12.98 via INTRAVENOUS

## 2017-01-18 ENCOUNTER — Inpatient Hospital Stay: Payer: BLUE CROSS/BLUE SHIELD | Attending: Hematology and Oncology | Admitting: Hematology and Oncology

## 2017-01-18 ENCOUNTER — Encounter: Payer: Self-pay | Admitting: Hematology and Oncology

## 2017-01-18 VITALS — BP 133/77 | HR 69 | Temp 97.4°F | Resp 18 | Wt 188.7 lb

## 2017-01-18 DIAGNOSIS — I2510 Atherosclerotic heart disease of native coronary artery without angina pectoris: Secondary | ICD-10-CM

## 2017-01-18 DIAGNOSIS — R5383 Other fatigue: Secondary | ICD-10-CM | POA: Diagnosis not present

## 2017-01-18 DIAGNOSIS — M797 Fibromyalgia: Secondary | ICD-10-CM

## 2017-01-18 DIAGNOSIS — Z79899 Other long term (current) drug therapy: Secondary | ICD-10-CM | POA: Diagnosis not present

## 2017-01-18 DIAGNOSIS — F419 Anxiety disorder, unspecified: Secondary | ICD-10-CM | POA: Diagnosis not present

## 2017-01-18 DIAGNOSIS — Z806 Family history of leukemia: Secondary | ICD-10-CM | POA: Diagnosis not present

## 2017-01-18 DIAGNOSIS — Z803 Family history of malignant neoplasm of breast: Secondary | ICD-10-CM | POA: Diagnosis not present

## 2017-01-18 DIAGNOSIS — I700 Atherosclerosis of aorta: Secondary | ICD-10-CM | POA: Diagnosis not present

## 2017-01-18 DIAGNOSIS — D472 Monoclonal gammopathy: Secondary | ICD-10-CM

## 2017-01-18 DIAGNOSIS — J45909 Unspecified asthma, uncomplicated: Secondary | ICD-10-CM | POA: Insufficient documentation

## 2017-01-18 NOTE — Progress Notes (Signed)
[facility] day: 01/18/2017  Chief Complaint: Jeanne Anderson is a 56 y.o. female with an abnormal SPEP who is seen for review of labs and PET scan and discussion regarding direction of therapy.  HPI:  The patient was last seen in the hematology clinic on 12/28/2016.  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  Given her neurologic symptoms, additional studies were ordered.  Work-up on 12/28/2016 revealed the following normal studies: TSH, serum viscosity, LDH, and beta-2 microglobulin.  Paraneoplastic panel was negative (anti-Hu antibodies, anti-Ri antibodies).   PET scan on 01/12/2017 revealed no evidence of hypermetabolic neoplastic disease.  There were chronic findings include aortic atherosclerosis, 1 vessel coronary atherosclerosis and mild diffuse hepatic steatosis.  Symptomatically, she denies any new complaints.  She notes the inability to lose weight.  She denies any change in neurologic symptoms.   Past Medical History:  Diagnosis Date  . Anxiety   . Arthritis   . Asthma   . Dizziness   . Fibromyalgia   . History of Epstein-Barr virus infection   . Memory changes   . Muscular pain   . Numbness and tingling   . Vision changes     Past Surgical History:  Procedure Laterality Date  . ABDOMINAL HYSTERECTOMY    . COLONOSCOPY  2016    Family History  Problem Relation Age of Onset  . Cancer Mother   . Leukemia Sister   . Breast cancer Sister     Social History:  reports that she has never smoked. She has never used smokeless tobacco. She reports that she drinks alcohol. She reports that she does not use drugs.  She denies any exposure to radiation or toxins.  She is a [employer].  She lives in [place].  The patient is alone today.  Allergies:  Allergies  Allergen Reactions  . Lidocaine     Other reaction(s): Unknown Weird things go numb per pt.   . Meloxicam     Other  reaction(s): Other (See Comments) Other Reaction: OTHER REACTION    Current Medications: Current Outpatient Prescriptions  Medication Sig Dispense Refill  . albuterol (PROVENTIL) (2.5 MG/3ML) 0.083% nebulizer solution Inhale into the lungs.    . ALPRAZolam (XANAX) 0.25 MG tablet Take 0.25 mg by mouth as needed.    . Budesonide (PULMICORT FLEXHALER IN) Inhale into the lungs.    . Cholecalciferol (VITAMIN D3) 5000 units CAPS Take 5,000 Units by mouth daily.    Marland Kitchen gabapentin (NEURONTIN) 300 MG capsule Take 300 mg by mouth 3 (three) times daily.  4  . ibuprofen (ADVIL,MOTRIN) 800 MG tablet Take 800 mg by mouth as needed.    Marland Kitchen PULMICORT FLEXHALER 180 MCG/ACT inhaler INHALE 1 INHALATION INTO THE LUNGS 2 (TWO) TIMES DAILY.  3  . triamcinolone (NASACORT) 55 MCG/ACT AERO nasal inhaler Place 1 spray into the nose daily.    . vitamin B-12 (CYANOCOBALAMIN) 1000 MCG tablet Take 1,000 mcg by mouth daily.     No current facility-administered medications for this visit.     Review of Systems:  GENERAL:  Extreme fatigue.  Feels cold ("marrow freezing").  No fevers, sweats or weight loss. Inability to lose weight. PERFORMANCE STATUS (ECOG):  1 HEENT:  Right eye "out of focus".  Norunny nose, sore throat, mouth sores or tenderness. Lungs:  Shortness of breath due to asthma.  No cough.  No hemoptysis. Cardiac:  No chest pain, palpitations, orthopnea, or  PND. GI:  Nausea.  No vomiting, diarrhea, constipation, melena or hematochezia.  Colonoscopy up-to-date. GU:  No urgency, frequency, dysuria, or hematuria. Musculoskeletal:  No back pain.  Arthritis.  No muscle tenderness. Extremities:  No pain or swelling. Skin:  No rashes or skin changes. Neuro:  Difficulty concentrating.  Tingling in face and arms.  (+) Vertigo.  (+) Migraine headaches.  No numbness or weakness, balance or coordination issues. Endocrine:  No diabetes, hot flashes or night sweats. (+) hair loss. (+) family history of thyroid disorders;  none diagnosed in the patient.  Psych:  No mood changes, depression or anxiety. Pain:  No focal pain. Review of systems:  All other systems reviewed and found to be negative.  Physical Exam: Blood pressure 133/77, pulse 69, temperature (!) 97.4 F (36.3 C), temperature source Tympanic, resp. rate 18, weight 188 lb 11.4 oz (85.6 kg). GENERAL:  Well developed, well nourished, woman sitting comfortably in the exam room in no acute distress. MENTAL STATUS:  Alert and oriented to person, place and time. HEAD:  Shoulder length brown hair.  Normocephalic, atraumatic, face symmetric, no Cushingoid features. EYES:  Glasses.  Brown eyes.  Pupils equal round and reactive to light and accomodation.  No conjunctivitis or scleral icterus. ENT:  Oropharynx clear without lesion.  Tongue normal. Mucous membranes moist.  RESPIRATORY:  Clear to auscultation without rales, wheezes or rhonchi. CARDIOVASCULAR:  Regular rate and rhythm without murmur, rub or gallop. ABDOMEN:  Soft, non-tender, with active bowel sounds, and no hepatosplenomegaly.  No masses. SKIN:  No rashes, ulcers or lesions. EXTREMITIES: No edema, no skin discoloration or tenderness.  No palpable cords. LYMPH NODES: No palpable cervical, supraclavicular, axillary or inguinal adenopathy  NEUROLOGICAL: Unremarkable. PSYCH:  Appropriate.   No visits with results within 3 Day(s) from this visit.  Latest known visit with results is:  Hospital Outpatient Visit on 01/12/2017  Component Date Value Ref Range Status  . Glucose-Capillary 01/12/2017 114* 65 - 99 mg/dL Final    Assessment:  Jeanne Anderson is a 56 y.o. female with a small monoclonal protein detected in 11/2016.  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  Head MRI on 12/06/2016 revealed no specific findings of multiple sclerosis. There were not multiple small nonspecific scattered foci of increased T2 weighted signal within the  subcortical and deep white matter likely indicating chronic microvascular ischemic changes or sequela due to migraine headaches.  CBC on 11/18/2016 revealed a hematocrit of 44.6, hemoglobin 15, MCV 91, platelets 283,000, white count 9200, and a normal differential.  SPEP revealed a 0.1 gm/dL IgM lambda monoclonal protein.  Normal studies included: B12, TSH.  Hepatitis C testing and HIV testing were negative on 08/29/2016.  ANA was negative on 02/10/2015.  Rheumatoid factor was negative on 12/13/2006.    Work-up on 12/28/2016 revealed a hematocrit of 44.3, hemoglobin 14.9, MCV 90.3, platelets 254,000, white count 9500 with an [facility] of 5600.  SPEP revealed a 0.2 g/dL IgM monoclonal protein with lambda light chain specificity.  IgG was 677 (low), IgA 72 (low), and IgM 228 (26-217).  Free light chain ratio was normal.  24-hour urine protein was less than 4.0 mg/dL. There was no monoclonal protein.  Immunofixation was normal.  Normal studies on 12/28/2016: TSH, serum viscosity, LDH, and beta-2 microglobulin.  Paraneoplastic panel was negative (anti-Hu antibodies, anti-Ri antibodies).  PET scan on 01/12/2017 revealed no evidence of hypermetabolic neoplastic disease.   Symptomatically, she continues to have neurologic symptoms.  She notes hair loss and muscle pain  She can not lose weight.  Exam is unremarkable.  Plan: 1.  Discuss negative work-up.  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  She declined bone marrow.  She is considering fat pad biopsy to r/o amyloid. 2.  RTC in 6 months for MD assessment and labs (CBC with diff, CMP, SPEP, immunoglobulins).   Lequita Asal, MD  01/18/2017, 9:11 AM

## 2017-01-18 NOTE — Progress Notes (Signed)
Patient offers no complaints today. 

## 2017-05-09 ENCOUNTER — Encounter: Payer: Self-pay | Admitting: Hematology and Oncology

## 2017-07-12 ENCOUNTER — Other Ambulatory Visit: Payer: BLUE CROSS/BLUE SHIELD

## 2017-07-19 ENCOUNTER — Ambulatory Visit: Payer: BLUE CROSS/BLUE SHIELD | Admitting: Hematology and Oncology

## 2017-07-20 ENCOUNTER — Inpatient Hospital Stay: Payer: BLUE CROSS/BLUE SHIELD | Attending: Hematology and Oncology

## 2017-07-20 DIAGNOSIS — R635 Abnormal weight gain: Secondary | ICD-10-CM | POA: Insufficient documentation

## 2017-07-20 DIAGNOSIS — G0490 Encephalitis and encephalomyelitis, unspecified: Secondary | ICD-10-CM | POA: Insufficient documentation

## 2017-07-20 DIAGNOSIS — R5383 Other fatigue: Secondary | ICD-10-CM | POA: Diagnosis not present

## 2017-07-20 DIAGNOSIS — Z806 Family history of leukemia: Secondary | ICD-10-CM | POA: Insufficient documentation

## 2017-07-20 DIAGNOSIS — Z79899 Other long term (current) drug therapy: Secondary | ICD-10-CM | POA: Insufficient documentation

## 2017-07-20 DIAGNOSIS — M797 Fibromyalgia: Secondary | ICD-10-CM | POA: Insufficient documentation

## 2017-07-20 DIAGNOSIS — D472 Monoclonal gammopathy: Secondary | ICD-10-CM | POA: Insufficient documentation

## 2017-07-20 DIAGNOSIS — Z803 Family history of malignant neoplasm of breast: Secondary | ICD-10-CM | POA: Insufficient documentation

## 2017-07-20 DIAGNOSIS — R202 Paresthesia of skin: Secondary | ICD-10-CM | POA: Diagnosis not present

## 2017-07-20 DIAGNOSIS — R5381 Other malaise: Secondary | ICD-10-CM | POA: Insufficient documentation

## 2017-07-20 DIAGNOSIS — Z888 Allergy status to other drugs, medicaments and biological substances status: Secondary | ICD-10-CM | POA: Diagnosis not present

## 2017-07-20 DIAGNOSIS — J45909 Unspecified asthma, uncomplicated: Secondary | ICD-10-CM | POA: Diagnosis not present

## 2017-07-20 LAB — COMPREHENSIVE METABOLIC PANEL
ALT: 23 U/L (ref 14–54)
AST: 23 U/L (ref 15–41)
Albumin: 4 g/dL (ref 3.5–5.0)
Alkaline Phosphatase: 63 U/L (ref 38–126)
Anion gap: 9 (ref 5–15)
BUN: 9 mg/dL (ref 6–20)
CO2: 28 mmol/L (ref 22–32)
Calcium: 8.9 mg/dL (ref 8.9–10.3)
Chloride: 101 mmol/L (ref 101–111)
Creatinine, Ser: 0.61 mg/dL (ref 0.44–1.00)
GFR calc Af Amer: >60 mL/min (ref >60)
GFR calc non Af Amer: >60 mL/min (ref >60)
Glucose, Bld: 115 mg/dL — ABNORMAL HIGH (ref 65–99)
Potassium: 3.9 mmol/L (ref 3.5–5.1)
Sodium: 138 mmol/L (ref 135–145)
Total Bilirubin: 1 mg/dL (ref 0.3–1.2)
Total Protein: 7.3 g/dL (ref 6.5–8.1)

## 2017-07-20 LAB — CBC WITH DIFFERENTIAL/PLATELET
Basophils Absolute: 0.1 10*3/uL (ref 0–0.1)
Basophils Relative: 1 %
Eosinophils Absolute: 0.2 10*3/uL (ref 0–0.7)
Eosinophils Relative: 3 %
HCT: 43.5 % (ref 35.0–47.0)
Hemoglobin: 14.9 g/dL (ref 12.0–16.0)
Lymphocytes Relative: 35 %
Lymphs Abs: 2.8 10*3/uL (ref 1.0–3.6)
MCH: 30.9 pg (ref 26.0–34.0)
MCHC: 34.2 g/dL (ref 32.0–36.0)
MCV: 90.5 fL (ref 80.0–100.0)
Monocytes Absolute: 0.6 10*3/uL (ref 0.2–0.9)
Monocytes Relative: 7 %
Neutro Abs: 4.3 10*3/uL (ref 1.4–6.5)
Neutrophils Relative %: 54 %
Platelets: 243 10*3/uL (ref 150–440)
RBC: 4.81 MIL/uL (ref 3.80–5.20)
RDW: 14.6 % — ABNORMAL HIGH (ref 11.5–14.5)
WBC: 8 10*3/uL (ref 3.6–11.0)

## 2017-07-21 LAB — PROTEIN ELECTROPHORESIS, SERUM
A/G Ratio: 1.3 (ref 0.7–1.7)
Albumin ELP: 3.8 g/dL (ref 2.9–4.4)
Alpha-1-Globulin: 0.2 g/dL (ref 0.0–0.4)
Alpha-2-Globulin: 0.8 g/dL (ref 0.4–1.0)
Beta Globulin: 1 g/dL (ref 0.7–1.3)
Gamma Globulin: 0.8 g/dL (ref 0.4–1.8)
Globulin, Total: 2.9 g/dL (ref 2.2–3.9)
M-Spike, %: 0.2 g/dL — ABNORMAL HIGH
Total Protein ELP: 6.7 g/dL (ref 6.0–8.5)

## 2017-07-23 LAB — IMMUNOGLOBULINS A/E/G/M, SERUM
IgA: 74 mg/dL — ABNORMAL LOW (ref 87–352)
IgE (Immunoglobulin E), Serum: 150 IU/mL — ABNORMAL HIGH (ref 0–100)
IgG (Immunoglobin G), Serum: 656 mg/dL — ABNORMAL LOW (ref 700–1600)
IgM (Immunoglobulin M), Srm: 240 mg/dL — ABNORMAL HIGH (ref 26–217)

## 2017-07-25 ENCOUNTER — Other Ambulatory Visit: Payer: Self-pay | Admitting: *Deleted

## 2017-07-25 DIAGNOSIS — D472 Monoclonal gammopathy: Secondary | ICD-10-CM

## 2017-07-26 ENCOUNTER — Inpatient Hospital Stay (HOSPITAL_BASED_OUTPATIENT_CLINIC_OR_DEPARTMENT_OTHER): Payer: BLUE CROSS/BLUE SHIELD | Admitting: Hematology and Oncology

## 2017-07-26 VITALS — BP 126/82 | HR 79 | Temp 98.2°F | Resp 20 | Wt 197.1 lb

## 2017-07-26 DIAGNOSIS — Z79899 Other long term (current) drug therapy: Secondary | ICD-10-CM

## 2017-07-26 DIAGNOSIS — Z806 Family history of leukemia: Secondary | ICD-10-CM | POA: Diagnosis not present

## 2017-07-26 DIAGNOSIS — R531 Weakness: Secondary | ICD-10-CM | POA: Diagnosis not present

## 2017-07-26 DIAGNOSIS — D472 Monoclonal gammopathy: Secondary | ICD-10-CM

## 2017-07-26 DIAGNOSIS — Z888 Allergy status to other drugs, medicaments and biological substances status: Secondary | ICD-10-CM

## 2017-07-26 DIAGNOSIS — Z803 Family history of malignant neoplasm of breast: Secondary | ICD-10-CM

## 2017-07-26 DIAGNOSIS — R202 Paresthesia of skin: Secondary | ICD-10-CM | POA: Diagnosis not present

## 2017-07-26 DIAGNOSIS — M797 Fibromyalgia: Secondary | ICD-10-CM

## 2017-07-26 DIAGNOSIS — R5383 Other fatigue: Secondary | ICD-10-CM | POA: Diagnosis not present

## 2017-07-26 DIAGNOSIS — G0490 Encephalitis and encephalomyelitis, unspecified: Secondary | ICD-10-CM

## 2017-07-26 DIAGNOSIS — J45909 Unspecified asthma, uncomplicated: Secondary | ICD-10-CM

## 2017-07-26 DIAGNOSIS — R635 Abnormal weight gain: Secondary | ICD-10-CM

## 2017-07-26 NOTE — Progress Notes (Signed)
Patient offers no complaints today.  She is scheduled for colonoscopy on Friday.

## 2017-07-26 NOTE — Progress Notes (Signed)
[facility] day: 07/26/2017  Chief Complaint: Jeanne Anderson is a 57 y.o. female with a monoclonal gammopathy of unknown significance (MGUS) who is seen for 6 month assesment.  HPI:  The patient was last seen in the hematology clinic on 01/18/2017.  At that time, she denied any new complaints.  She continued to have neurologic symptoms.  She noted hair loss and muscle pain   Exam  was unremarkable. PET scan was negative.  Paraneoplastic panel was negative. She was considering fat pad biopsy.  During the interim, she has been diagnosed with myalgic encephalomyelitis.  She describes the key to diagnosis as post exertional malaise and pain.  She feels like a million burning needles are in her muscles.  She describes a sensation like electric shock in her head.  Bright light bother her.  She has a follow-up in 6 months with neurology.  She denies any bone pain or interval infections.   Past Medical History:  Diagnosis Date  . Anxiety   . Arthritis   . Asthma   . Dizziness   . Fibromyalgia   . History of Epstein-Barr virus infection   . Memory changes   . Muscular pain   . Numbness and tingling   . Vision changes     Past Surgical History:  Procedure Laterality Date  . ABDOMINAL HYSTERECTOMY    . COLONOSCOPY  2016    Family History  Problem Relation Age of Onset  . Cancer Mother   . Leukemia Sister   . Breast cancer Sister     Social History:  reports that  has never smoked. she has never used smokeless tobacco. She reports that she drinks alcohol. She reports that she does not use drugs.  She denies any exposure to radiation or toxins.  She is a [employer].  She lives in [place].  The patient is alone today.  Allergies:  Allergies  Allergen Reactions  . Lidocaine     Other reaction(s): Unknown Weird things go numb per pt.   . Meloxicam     Other reaction(s): Other (See Comments) Other Reaction: OTHER REACTION     Current Medications: Current Outpatient Medications  Medication Sig Dispense Refill  . albuterol (PROVENTIL) (2.5 MG/3ML) 0.083% nebulizer solution Inhale into the lungs.    . ALPRAZolam (XANAX) 0.25 MG tablet Take 0.25 mg by mouth as needed.    . Budesonide (PULMICORT FLEXHALER IN) Inhale into the lungs.    . Cholecalciferol (VITAMIN D3) 5000 units CAPS Take 5,000 Units by mouth daily.    Marland Kitchen gabapentin (NEURONTIN) 300 MG capsule Take 300 mg by mouth 3 (three) times daily.  4  . ibuprofen (ADVIL,MOTRIN) 800 MG tablet Take 800 mg by mouth as needed.    Marland Kitchen PULMICORT FLEXHALER 180 MCG/ACT inhaler INHALE 1 INHALATION INTO THE LUNGS 2 (TWO) TIMES DAILY.  3  . triamcinolone (NASACORT) 55 MCG/ACT AERO nasal inhaler Place 1 spray into the nose daily.    . vitamin B-12 (CYANOCOBALAMIN) 1000 MCG tablet Take 1,000 mcg by mouth daily.     No current facility-administered medications for this visit.     Review of Systems:  GENERAL:  Post exertional fatigue/malaise.  No fevers, sweats or weight loss. Inability to lose weight.  Weight gain of 9 pounds. PERFORMANCE STATUS (ECOG):  1 HEENT:  Right eye "out of focus".  No runny nose, sore throat, mouth sores or tenderness. Lungs:  Shortness of breath due to asthma.  No cough.  No hemoptysis. Cardiac:  No chest pain, palpitations, orthopnea, or PND. GI:  Nausea.  No vomiting, diarrhea, constipation, melena or hematochezia.  Colonoscopy up-to-date. GU:  No urgency, frequency, dysuria, or hematuria. Musculoskeletal:  "Hot needle sensation in muscles".  No back pain.  Arthritis.  No muscle tenderness. Extremities:  No pain or swelling. Skin:  No rashes or skin changes. Neuro:  Difficulty concentrating.  Tingling in face and arms.  (+) Vertigo.  (+) Migraine headaches.  No numbness or weakness, balance or coordination issues. Endocrine:  No diabetes, hot flashes or night sweats. (+) hair loss. (+) family history of thyroid disorders.  Psych:  No mood  changes, depression or anxiety. Pain:  No focal pain. Review of systems:  All other systems reviewed and found to be negative.  Physical Exam: Blood pressure 126/82, pulse 79, temperature 98.2 F (36.8 C), temperature source Tympanic, resp. rate 20, weight 197 lb 1.5 oz (89.4 kg). GENERAL:  Well developed, well nourished, woman sitting comfortably in the exam room in no acute distress. MENTAL STATUS:  Alert and oriented to person, place and time. HEAD:  Shoulder length brown hair.  Normocephalic, atraumatic, face symmetric, no Cushingoid features. EYES:  Glasses.  Brown eyes.  Pupils equal round and reactive to light and accomodation.  No conjunctivitis or scleral icterus. ENT:  Oropharynx clear without lesion.  Tongue normal. Mucous membranes moist.  RESPIRATORY:  Clear to auscultation without rales, wheezes or rhonchi. CARDIOVASCULAR:  Regular rate and rhythm without murmur, rub or gallop. ABDOMEN:  Soft, non-tender, with active bowel sounds, and no hepatosplenomegaly.  No masses. SKIN:  No rashes, ulcers or lesions. EXTREMITIES: No edema, no skin discoloration or tenderness.  No palpable cords. LYMPH NODES: No palpable cervical, supraclavicular, axillary or inguinal adenopathy  NEUROLOGICAL: Unremarkable.  Smiling. PSYCH:  Appropriate.   No visits with results within 3 Day(s) from this visit.  Latest known visit with results is:  Appointment on 07/20/2017  Component Date Value Ref Range Status  . IgG (Immunoglobin G), Serum 07/20/2017 656* 700 - 1,600 mg/dL Final  . IgA 07/20/2017 74* 87 - 352 mg/dL Final  . IgM (Immunoglobulin M), Srm 07/20/2017 240* 26 - 217 mg/dL Final  . IgE (Immunoglobulin E), Serum 07/20/2017 150* 0 - 100 IU/mL Final   Comment: (NOTE) Performed At: [facility] [place], [place] [id number] Rush Farmer MD [id number] Performed at [facility], [address], [place], [place] [address]   . Total Protein ELP  07/20/2017 6.7  6.0 - 8.5 g/dL Final  . Albumin ELP 07/20/2017 3.8  2.9 - 4.4 g/dL Final  . Alpha-1-Globulin 07/20/2017 0.2  0.0 - 0.4 g/dL Final  . Alpha-2-Globulin 07/20/2017 0.8  0.4 - 1.0 g/dL Final  . Beta Globulin 07/20/2017 1.0  0.7 - 1.3 g/dL Final  . Gamma Globulin 07/20/2017 0.8  0.4 - 1.8 g/dL Final  . M-Spike, % 07/20/2017 0.2* Not Observed g/dL Final  . SPE Interp. 07/20/2017 Comment   Final   Comment: (NOTE) The SPE pattern demonstrates a single peak (M-spike) in the gamma region which may represent monoclonal protein. This peak may also be caused by circulating immune complexes, cryoglobulins, C-reactive protein, fibrinogen or hemolysis.  If clinically indicated, the presence of a monoclonal gammopathy may be confirmed by immuno- fixation, as well as an evaluation of the urine for the presence of Bence-Jones protein. Performed At: [facility] [place], [place] [id number] Rush Farmer MD [id number]   .  Comment 07/20/2017 Comment   Final   Comment: (NOTE) Protein electrophoresis scan will follow via computer, mail, or courier delivery.   Marland Kitchen GLOBULIN, TOTAL 07/20/2017 2.9  2.2 - 3.9 g/dL Corrected  . A/G Ratio 07/20/2017 1.3  0.7 - 1.7 Corrected   Performed at [facility], [address], [place], [place] [address]  . Sodium 07/20/2017 138  135 - 145 mmol/L Final  . Potassium 07/20/2017 3.9  3.5 - 5.1 mmol/L Final  . Chloride 07/20/2017 101  101 - 111 mmol/L Final  . CO2 07/20/2017 28  22 - 32 mmol/L Final  . Glucose, Bld 07/20/2017 115* 65 - 99 mg/dL Final  . BUN 07/20/2017 9  6 - 20 mg/dL Final  . Creatinine, Ser 07/20/2017 0.61  0.44 - 1.00 mg/dL Final  . Calcium 07/20/2017 8.9  8.9 - 10.3 mg/dL Final  . Total Protein 07/20/2017 7.3  6.5 - 8.1 g/dL Final  . Albumin 07/20/2017 4.0  3.5 - 5.0 g/dL Final  . AST 07/20/2017 23  15 - 41 U/L Final  . ALT 07/20/2017 23  14 - 54 U/L Final  . Alkaline Phosphatase 07/20/2017 63  38  - 126 U/L Final  . Total Bilirubin 07/20/2017 1.0  0.3 - 1.2 mg/dL Final  . GFR calc non Af Amer 07/20/2017 >60  >60 mL/min Final  . GFR calc Af Amer 07/20/2017 >60  >60 mL/min Final   Comment: (NOTE) The eGFR has been calculated using the CKD EPI equation. This calculation has not been validated in all clinical situations. eGFR's persistently <60 mL/min signify possible Chronic Kidney Disease.   Georgiann Hahn gap 07/20/2017 9  5 - 15 Final   Performed at [facility], [address] [place], [place] [address]  . WBC 07/20/2017 8.0  3.6 - 11.0 K/uL Final  . RBC 07/20/2017 4.81  3.80 - 5.20 MIL/uL Final  . Hemoglobin 07/20/2017 14.9  12.0 - 16.0 g/dL Final  . HCT 07/20/2017 43.5  35.0 - 47.0 % Final  . MCV 07/20/2017 90.5  80.0 - 100.0 fL Final  . MCH 07/20/2017 30.9  26.0 - 34.0 pg Final  . MCHC 07/20/2017 34.2  32.0 - 36.0 g/dL Final  . RDW 07/20/2017 14.6* 11.5 - 14.5 % Final  . Platelets 07/20/2017 243  150 - 440 K/uL Final  . Neutrophils Relative % 07/20/2017 54  % Final  . Neutro Abs 07/20/2017 4.3  1.4 - 6.5 K/uL Final  . Lymphocytes Relative 07/20/2017 35  % Final  . Lymphs Abs 07/20/2017 2.8  1.0 - 3.6 K/uL Final  . Monocytes Relative 07/20/2017 7  % Final  . Monocytes Absolute 07/20/2017 0.6  0.2 - 0.9 K/uL Final  . Eosinophils Relative 07/20/2017 3  % Final  . Eosinophils Absolute 07/20/2017 0.2  0 - 0.7 K/uL Final  . Basophils Relative 07/20/2017 1  % Final  . Basophils Absolute 07/20/2017 0.1  0 - 0.1 K/uL Final   Performed at [facility], [address] [place], [place] [address]    Assessment:  Dima Mini is a 57 y.o. female with a monoclonal gammopathy of unknown significance (MGUS).  She was noted to have a small monoclonal protein in 11/2016.  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  She was diagnosed with myalgic encephalomyelitis.  Head MRI on 12/06/2016 revealed no specific  findings of multiple sclerosis. There were not multiple small nonspecific scattered foci of increased T2  weighted signal within the subcortical and deep white matter likely indicating chronic microvascular ischemic changes or sequela due to migraine headaches.  CBC on 11/18/2016 revealed a hematocrit of 44.6, hemoglobin 15, MCV 91, platelets 283,000, white count 9200, and a normal differential.  SPEP revealed a 0.1 gm/dL IgM lambda monoclonal protein.  Normal studies included: B12, TSH.  Hepatitis C testing and HIV testing were negative on 08/29/2016.  ANA was negative on 02/10/2015.  Rheumatoid factor was negative on 12/13/2006.    Work-up on 12/28/2016 revealed a hematocrit of 44.3, hemoglobin 14.9, MCV 90.3, platelets 254,000, white count 9500 with an [place] of 5600.  SPEP revealed a 0.2 g/dL IgM monoclonal protein with lambda light chain specificity.  IgG was 677 (low), IgA 72 (low), and IgM 228 (26-217).  Free light chain ratio was normal.  24-hour urine protein was less than 4.0 mg/dL. There was no monoclonal protein.  Immunofixation was normal.  Normal studies on 12/28/2016: TSH, serum viscosity, LDH, and beta-2 microglobulin.  Paraneoplastic panel was negative (anti-Hu antibodies, anti-Ri antibodies).  PET scan on 01/12/2017 revealed no evidence of hypermetabolic neoplastic disease.  She declined bone marrow biopsy.  SPEP has been followed: 0.1 on 11/18/2016, 0.2 on 12/28/2016, and 0.2 on 07/20/2017.  She was diagnosed with myalgic encephalomyelitis.  She is followed by Dr. Nigel Bridgeman at [facility].  Symptomatically, she continues to have neurologic symptoms.  She denies any interval infections or bone pain.  Exam is stable.  Plan: 1.  Labs today: CBC with diff, CMP, SPEP, immunoglobulins. 2.  Discuss interval diagnosis of myalgic encephalomyelitis. 3.  Discuss prior conversation re: fat pad biopsy. 4.  Follow-up with Dr. Annamaria Helling, neurologist, as scheduled. 5.  RTC in 6 months for MD  assessment and labs (CBC with diff, CMP, SPEP, immunoglobulins).   Nolon Stalls, MD 07/26/2017, 3:35  PM

## 2017-08-12 ENCOUNTER — Encounter: Payer: Self-pay | Admitting: Hematology and Oncology

## 2018-02-14 ENCOUNTER — Inpatient Hospital Stay: Payer: BLUE CROSS/BLUE SHIELD | Attending: Hematology and Oncology

## 2018-02-14 ENCOUNTER — Inpatient Hospital Stay: Payer: BLUE CROSS/BLUE SHIELD | Admitting: Hematology and Oncology

## 2018-02-14 ENCOUNTER — Encounter: Payer: Self-pay | Admitting: Hematology and Oncology

## 2018-02-14 ENCOUNTER — Other Ambulatory Visit: Payer: Self-pay | Admitting: Hematology and Oncology

## 2018-02-14 VITALS — BP 127/82 | HR 69 | Temp 96.7°F | Resp 18 | Wt 194.2 lb

## 2018-02-14 DIAGNOSIS — M1990 Unspecified osteoarthritis, unspecified site: Secondary | ICD-10-CM | POA: Diagnosis not present

## 2018-02-14 DIAGNOSIS — F419 Anxiety disorder, unspecified: Secondary | ICD-10-CM

## 2018-02-14 DIAGNOSIS — L659 Nonscarring hair loss, unspecified: Secondary | ICD-10-CM

## 2018-02-14 DIAGNOSIS — R42 Dizziness and giddiness: Secondary | ICD-10-CM

## 2018-02-14 DIAGNOSIS — M797 Fibromyalgia: Secondary | ICD-10-CM

## 2018-02-14 DIAGNOSIS — Z90710 Acquired absence of both cervix and uterus: Secondary | ICD-10-CM | POA: Diagnosis not present

## 2018-02-14 DIAGNOSIS — H43391 Other vitreous opacities, right eye: Secondary | ICD-10-CM | POA: Diagnosis not present

## 2018-02-14 DIAGNOSIS — R5381 Other malaise: Secondary | ICD-10-CM | POA: Diagnosis not present

## 2018-02-14 DIAGNOSIS — Z79899 Other long term (current) drug therapy: Secondary | ICD-10-CM | POA: Insufficient documentation

## 2018-02-14 DIAGNOSIS — R5383 Other fatigue: Secondary | ICD-10-CM | POA: Diagnosis not present

## 2018-02-14 DIAGNOSIS — D472 Monoclonal gammopathy: Secondary | ICD-10-CM | POA: Insufficient documentation

## 2018-02-14 DIAGNOSIS — G0490 Encephalitis and encephalomyelitis, unspecified: Secondary | ICD-10-CM | POA: Insufficient documentation

## 2018-02-14 DIAGNOSIS — R61 Generalized hyperhidrosis: Secondary | ICD-10-CM | POA: Diagnosis not present

## 2018-02-14 LAB — COMPREHENSIVE METABOLIC PANEL
ALT: 26 U/L (ref 0–44)
AST: 21 U/L (ref 15–41)
Albumin: 4.4 g/dL (ref 3.5–5.0)
Alkaline Phosphatase: 58 U/L (ref 38–126)
Anion gap: 11 (ref 5–15)
BUN: 18 mg/dL (ref 6–20)
CO2: 25 mmol/L (ref 22–32)
Calcium: 8.8 mg/dL — ABNORMAL LOW (ref 8.9–10.3)
Chloride: 103 mmol/L (ref 98–111)
Creatinine, Ser: 0.61 mg/dL (ref 0.44–1.00)
GFR calc Af Amer: >60 mL/min (ref >60)
GFR calc non Af Amer: >60 mL/min (ref >60)
Glucose, Bld: 110 mg/dL — ABNORMAL HIGH (ref 70–99)
Potassium: 4 mmol/L (ref 3.5–5.1)
Sodium: 139 mmol/L (ref 135–145)
Total Bilirubin: 0.6 mg/dL (ref 0.3–1.2)
Total Protein: 7.6 g/dL (ref 6.5–8.1)

## 2018-02-14 LAB — CBC WITH DIFFERENTIAL/PLATELET
Basophils Absolute: 0.1 10*3/uL (ref 0–0.1)
Basophils Relative: 1 %
Eosinophils Absolute: 0.2 10*3/uL (ref 0–0.7)
Eosinophils Relative: 3 %
HCT: 43.5 % (ref 35.0–47.0)
Hemoglobin: 14.6 g/dL (ref 12.0–16.0)
Lymphocytes Relative: 32 %
Lymphs Abs: 2.1 10*3/uL (ref 1.0–3.6)
MCH: 30.6 pg (ref 26.0–34.0)
MCHC: 33.5 g/dL (ref 32.0–36.0)
MCV: 91.4 fL (ref 80.0–100.0)
Monocytes Absolute: 0.4 10*3/uL (ref 0.2–0.9)
Monocytes Relative: 7 %
Neutro Abs: 3.8 10*3/uL (ref 1.4–6.5)
Neutrophils Relative %: 57 %
Platelets: 229 10*3/uL (ref 150–440)
RBC: 4.76 MIL/uL (ref 3.80–5.20)
RDW: 14.9 % — ABNORMAL HIGH (ref 11.5–14.5)
WBC: 6.6 10*3/uL (ref 3.6–11.0)

## 2018-02-14 NOTE — Progress Notes (Signed)
Patient is having hip replacement in September.  Other than hip pain, patient has no complaints.  Awakens during the night with hip pain.

## 2018-02-14 NOTE — Progress Notes (Signed)
[facility] day: 02/14/2018   Chief Complaint: Jeanne Anderson is a 57 y.o. female with a monoclonal gammopathy of unknown significance (MGUS) who is seen for 6 month assesment.  HPI:  The patient was last seen in the hematology clinic on 07/26/2017.  At that time,  she had ongoing neurologic symptoms.  She had been diagnosed with myalgic encephalomyelitis.  She denied any interval infections or bone pain.  Exam was stable.  SPEP revealed a 0.2 gm/dl spike in the gamma region.  She was seen by Dr. Annamaria Helling, neurologist at [facility], on 10/30/2017.  She had stable myalgic encephalomyelitis.  Gabapentin remained at 300 mg.  Carbamazepine was added to see if it improved her head symptoms (pins and needles going into her brain).  Dextroamphetamine was prescribed.  During the interim, patient has been stressed related to upcoming surgery. Patient is scheduled to undergo hip surgery soon with Dr. Ilda Mori ([facility]). Patient continues to have daily episodes of diaphoresis. She states, "I went through menopause 15 years ago, but then I have meltdowns of my body every afternoon at 3:00, and then 1 or 2 times at night". Patient notes continued hair thinning and exertional fatigue.   Headaches have resolved. The tingling sensation in her face has also resolved. She continues to have the sensation of "hot needles" in her muscles and vertigo symptoms, which she attributes to her myalgic encephalomyelitis diagnosis. Patient have episodes of "brain fog". 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   Patient denies that she has experienced any B symptoms. She denies any interval infections. Patient advises that she maintains an adequate appetite. She is eating well. Weight today is 194 lb 3.6 oz (88.1 kg), which compared to her last visit to the clinic, represents a 3 pound decrease.    Patient complains of pain rated 6/10 in the clinic  today.   Past Medical History:  Diagnosis Date  . Anxiety   . Arthritis   . Asthma   . Dizziness   . Fibromyalgia   . History of Epstein-Barr virus infection   . Memory changes   . Muscular pain   . Numbness and tingling   . Vision changes     Past Surgical History:  Procedure Laterality Date  . ABDOMINAL HYSTERECTOMY    . COLONOSCOPY  2016    Family History  Problem Relation Age of Onset  . Cancer Mother   . Leukemia Sister   . Breast cancer Sister     Social History:  reports that she has never smoked. She has never used smokeless tobacco. She reports that she drinks alcohol. She reports that she does not use drugs.  She denies any exposure to radiation or toxins.  She is a [employer] officer.  She lives in [place].  The patient is alone today.  Allergies:  Allergies  Allergen Reactions  . Lidocaine     Other reaction(s): Unknown Weird things go numb per pt.   . Meloxicam     Other reaction(s): Other (See Comments) Other Reaction: OTHER REACTION    Current Medications: Current Outpatient Medications  Medication Sig Dispense Refill  . albuterol (PROVENTIL) (2.5 MG/3ML) 0.083% nebulizer solution Inhale into the lungs.    . ALPRAZolam (XANAX) 0.25 MG tablet Take 0.25 mg by mouth as needed.    . Budesonide (PULMICORT FLEXHALER IN) Inhale into the lungs.    . Cholecalciferol (VITAMIN D3) 5000 units CAPS Take  5,000 Units by mouth daily.    Marland Kitchen gabapentin (NEURONTIN) 300 MG capsule Take 300 mg by mouth 3 (three) times daily.  4  . ibuprofen (ADVIL,MOTRIN) 800 MG tablet Take 800 mg by mouth as needed.    Marland Kitchen PULMICORT FLEXHALER 180 MCG/ACT inhaler INHALE 1 INHALATION INTO THE LUNGS 2 (TWO) TIMES DAILY.  3  . triamcinolone (NASACORT) 55 MCG/ACT AERO nasal inhaler Place 1 spray into the nose daily.    . vitamin B-12 (CYANOCOBALAMIN) 1000 MCG tablet Take 1,000 mcg by mouth daily.     No current facility-administered medications for this visit.     Review of Systems   Constitutional: Positive for diaphoresis and malaise/fatigue. Negative for fever and weight loss (down 3 pounds).  HENT: Negative.        Hair thinning  Eyes: Negative.  Negative for pain and redness.       Floaters and "flashy things" in RIGHT eye; followed by opthalmology.  Respiratory: Negative for cough, hemoptysis, sputum production and shortness of breath.   Cardiovascular: Negative for chest pain, palpitations, orthopnea, leg swelling and PND.  Gastrointestinal: Negative for abdominal pain, blood in stool, constipation, diarrhea, melena, nausea and vomiting.  Genitourinary: Negative for dysuria, frequency, hematuria and urgency.  Musculoskeletal: Positive for joint pain (hips - scheduled for surgery) and myalgias ("hot needles" sensation to muscles). Negative for back pain and falls.  Skin: Negative for itching and rash.  Neurological: Positive for dizziness. Negative for tremors, weakness and headaches.  Endo/Heme/Allergies: Does not bruise/bleed easily.  Psychiatric/Behavioral: Negative for depression, memory loss and suicidal ideas. The patient is nervous/anxious (increased stress). The patient does not have insomnia.   All other systems reviewed and are negative.  Performance status (ECOG): 1 - Symptomatic but completely ambulatory  Vital Signs BP 127/82 (BP Location: Left Arm, Patient Position: Sitting)   Pulse 69   Temp (!) 96.7 F (35.9 C) (Tympanic)   Resp 18   Wt 194 lb 3.6 oz (88.1 kg)   SpO2 100%   BMI 34.41 kg/m   Physical Exam  Constitutional: She is oriented to person, place, and time and well-developed, well-nourished, and in no distress.  HENT:  Head: Normocephalic and atraumatic. Hair is abnormal (thinning).  Long brown hair.  Eyes: Pupils are equal, round, and reactive to light. EOM are normal. No scleral icterus.  Glasses.  Brown eyes.  Neck: Normal range of motion. Neck supple. No tracheal deviation present. No thyromegaly present.  Cardiovascular:  Normal rate, regular rhythm and normal heart sounds. Exam reveals no gallop and no friction rub.  No murmur heard. Pulmonary/Chest: Effort normal and breath sounds normal. No respiratory distress. She has no wheezes. She has no rales.  Abdominal: Soft. Bowel sounds are normal. She exhibits no distension. There is no tenderness.  Musculoskeletal: Normal range of motion. She exhibits no edema or tenderness.  Lymphadenopathy:    She has no cervical adenopathy.    She has no axillary adenopathy.       Right: No inguinal and no supraclavicular adenopathy present.       Left: No inguinal and no supraclavicular adenopathy present.  Neurological: She is alert and oriented to person, place, and time.  Skin: Skin is warm and dry. No rash noted. No erythema.  Psychiatric: Affect and judgment normal. Her mood appears anxious.  Nursing note and vitals reviewed.   Appointment on 02/14/2018  Component Date Value Ref Range Status  . WBC 02/14/2018 6.6  3.6 - 11.0 K/uL Final  .  RBC 02/14/2018 4.76  3.80 - 5.20 MIL/uL Final  . Hemoglobin 02/14/2018 14.6  12.0 - 16.0 g/dL Final  . HCT 02/14/2018 43.5  35.0 - 47.0 % Final  . MCV 02/14/2018 91.4  80.0 - 100.0 fL Final  . MCH 02/14/2018 30.6  26.0 - 34.0 pg Final  . MCHC 02/14/2018 33.5  32.0 - 36.0 g/dL Final  . RDW 02/14/2018 14.9* 11.5 - 14.5 % Final  . Platelets 02/14/2018 229  150 - 440 K/uL Final  . Neutrophils Relative % 02/14/2018 57  % Final  . Neutro Abs 02/14/2018 3.8  1.4 - 6.5 K/uL Final  . Lymphocytes Relative 02/14/2018 32  % Final  . Lymphs Abs 02/14/2018 2.1  1.0 - 3.6 K/uL Final  . Monocytes Relative 02/14/2018 7  % Final  . Monocytes Absolute 02/14/2018 0.4  0.2 - 0.9 K/uL Final  . Eosinophils Relative 02/14/2018 3  % Final  . Eosinophils Absolute 02/14/2018 0.2  0 - 0.7 K/uL Final  . Basophils Relative 02/14/2018 1  % Final  . Basophils Absolute 02/14/2018 0.1  0 - 0.1 K/uL Final   Performed at [facility], [address] [place], [place] [address]    Assessment:  Jeanne Anderson is a 57 y.o. female with a monoclonal gammopathy of unknown significance (MGUS).  She was noted to have a small monoclonal protein in 11/2016.  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  She was diagnosed with myalgic encephalomyelitis.  Head MRI on 12/06/2016 revealed no specific findings of multiple sclerosis. There were not multiple small nonspecific scattered foci of increased T2 weighted signal within the subcortical and deep white matter likely indicating chronic microvascular ischemic changes or sequela due to migraine headaches.  CBC on 11/18/2016 revealed a hematocrit of 44.6, hemoglobin 15, MCV 91, platelets 283,000, white count 9200, and a normal differential.  SPEP revealed a 0.1 gm/dL IgM lambda monoclonal protein.  Normal studies included: B12, TSH.  Hepatitis C testing and HIV testing were negative on 08/29/2016.  ANA was negative on 02/10/2015.  Rheumatoid factor was negative on 12/13/2006.    Work-up on 12/28/2016 revealed a hematocrit of 44.3, hemoglobin 14.9, MCV 90.3, platelets 254,000, white count 9500 with an [place] of 5600.  SPEP revealed a 0.2 g/dL IgM monoclonal protein with lambda light chain specificity.  IgG was 677 (low), IgA 72 (low), and IgM 228 (26-217).  Free light chain ratio was normal.  24-hour urine protein was less than 4.0 mg/dL. There was no monoclonal protein.  Immunofixation was normal.  Normal studies on 12/28/2016: TSH, serum viscosity, LDH, and beta-2 microglobulin.  Paraneoplastic panel was negative (anti-Hu antibodies, anti-Ri antibodies).  PET scan on 01/12/2017 revealed no evidence of hypermetabolic neoplastic disease.  She declined bone marrow biopsy.  SPEP has been followed: 0.1 on 11/18/2016, 0.2 on 12/28/2016, 0.2 on 07/20/2017, and 0.1 on 07/20/2017.  She was diagnosed with myalgic encephalomyelitis.  She is followed by Dr.  Nigel Bridgeman at [facility].  Symptomatically, patient is doing well overall. She is stress due to upcoming hip surgery. Daily sweating episodes. She has intermittent brain fog. Her muscles often have the sensation of "burning needles", which she attributes to her myalgia encephalomyelitis diagnosis. She denies any interval infections or bone pain.  Exam is stable.  Plan: 1. Labs today: CBC with diff, CMP, myeloma panel, zinc, copper. 2. MGUS - stable  Doing well. Denies recurrent infections. Discussed 1% annual risk of transformation into multiple myeloma.  Will send myeloma panel today.  3. Myalgic encheplomyelitis - stable  Continue to follow up with neurology as already scheduled. 4. Hip pain - ongoing  Scheduled for surgical intervention soon.  Will discuss potential pathology on hip in place of previously discussed fat pad biopsy.   5. Eye issues - ongoing  Continues to have floaters and "flashy things" in her RIGHT eye. She is followed opthalmology. Continue with scheduled follow up appointments for further evaluation and treatment.  6. RTC in 6 months for MD assessment and labs (CBC with diff, CMP, SPEP, immunoglobulins).    Honor Loh, NP 02/14/2018, 10:18 AM   I saw and evaluated the patient, participating in the key portions of the service and reviewing pertinent diagnostic studies and records.  I reviewed the nurse practitioner's note and agree with the findings and the plan.  The assessment and plan were discussed with the patient.  Multiple questions were asked by the patient and answered.   Nolon Stalls, MD 02/14/2018,10:18 AM

## 2018-02-16 LAB — MULTIPLE MYELOMA PANEL, SERUM
Albumin SerPl Elph-Mcnc: 3.8 g/dL (ref 2.9–4.4)
Albumin/Glob SerPl: 1.3 (ref 0.7–1.7)
Alpha 1: 0.2 g/dL (ref 0.0–0.4)
Alpha2 Glob SerPl Elph-Mcnc: 0.8 g/dL (ref 0.4–1.0)
B-Globulin SerPl Elph-Mcnc: 1.1 g/dL (ref 0.7–1.3)
Gamma Glob SerPl Elph-Mcnc: 0.8 g/dL (ref 0.4–1.8)
Globulin, Total: 3 g/dL (ref 2.2–3.9)
IgA: 71 mg/dL — ABNORMAL LOW (ref 87–352)
IgG (Immunoglobin G), Serum: 668 mg/dL — ABNORMAL LOW (ref 700–1600)
IgM (Immunoglobulin M), Srm: 216 mg/dL (ref 26–217)
M Protein SerPl Elph-Mcnc: 0.2 g/dL — ABNORMAL HIGH
Total Protein ELP: 6.8 g/dL (ref 6.0–8.5)

## 2018-02-16 LAB — ZINC: Zinc: 92 ug/dL (ref 56–134)

## 2018-02-16 LAB — COPPER, SERUM: Copper: 146 ug/dL (ref 72–166)

## 2018-04-15 ENCOUNTER — Encounter: Payer: Self-pay | Admitting: Hematology and Oncology

## 2018-08-15 ENCOUNTER — Ambulatory Visit: Payer: BLUE CROSS/BLUE SHIELD | Admitting: Hematology and Oncology

## 2018-08-15 ENCOUNTER — Other Ambulatory Visit: Payer: BLUE CROSS/BLUE SHIELD

## 2018-08-19 ENCOUNTER — Other Ambulatory Visit: Payer: Self-pay | Admitting: Hematology and Oncology

## 2018-08-19 DIAGNOSIS — D472 Monoclonal gammopathy: Secondary | ICD-10-CM

## 2018-08-19 NOTE — Progress Notes (Signed)
[facility] day: 08/20/2018   Chief Complaint: Jeanne Anderson is a 57 y.o. female with a monoclonal gammopathy of unknown significance (MGUS) who is seen for 6 month assesment.  HPI:  The patient was last seen in the hematology clinic on 02/14/2018.  At that time, she was doing well overall. She was stressed due to upcoming hip surgery. Daily sweating episodes. She had intermittent brain fog. Her muscles had the sensation of "burning needles", which she attributed to her myalgia encephalomyelitis diagnosis. She denied any interval infections or bone pain.  Exam was stable. M-spike revealed a 0.2 gm/dL IgM monoclonal protein with lambda light chain specificity.  CBC was normal.  IgM was 216 (normal).  Creatinine was 0.61.  Calcium was 8.8.  Copper and zinc were normal.  Patient is s/p RIGHT hip surgery done at [facility] in [place], [place]. Patient recovering well with no noted post-operative complications.   During the interim, patient is doing well.  Her daily diaphoresis has improved. Brain fog has gotten "much better". Sensation of "burning needles" has also improved. Patient denies that she has experienced any B symptoms. She denies any interval infections. She denies nausea, vomiting, and changes to her bowel habits. She has not appreciated any bruising, bleeding, or areas of palpable adenopathy.   Patient advises that she maintains an adequate appetite. She is eating well. Weight today is 195 lb 15.8 oz (88.9 kg), which compared to her last visit to the clinic, represents a 1 pound increase.  Patient denies pain in the clinic today.   Past Medical History:  Diagnosis Date  . Anxiety   . Arthritis   . Asthma   . Dizziness   . Fibromyalgia   . History of Epstein-Barr virus infection   . Memory changes   . Muscular pain   . Numbness and tingling   . Vision changes     Past Surgical History:  Procedure Laterality Date   . ABDOMINAL HYSTERECTOMY    . COLONOSCOPY  2016  . HIP ARTHROPLASTY      Family History  Problem Relation Age of Onset  . Cancer Mother   . Leukemia Sister   . Breast cancer Sister     Social History:  reports that she has never smoked. She has never used smokeless tobacco. She reports current alcohol use. She reports that she does not use drugs.  She denies any exposure to radiation or toxins.  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  The patient is alone today.  Allergies:  Allergies  Allergen Reactions  . Lidocaine     Other reaction(s): Unknown Weird things go numb per pt.   . Meloxicam     Other reaction(s): Other (See Comments) Other Reaction: OTHER REACTION    Current Medications: Current Outpatient Medications  Medication Sig Dispense Refill  . Cholecalciferol (VITAMIN D3) 5000 units CAPS Take 5,000 Units by mouth daily.    Marland Kitchen gabapentin (NEURONTIN) 300 MG capsule Take 300 mg by mouth 3 (three) times daily.  4  . vitamin B-12 (CYANOCOBALAMIN) 1000 MCG tablet Take 1,000 mcg by mouth daily.    Marland Kitchen albuterol (PROVENTIL) (2.5 MG/3ML) 0.083% nebulizer solution Inhale into the lungs.    . ALPRAZolam (XANAX) 0.25 MG tablet Take 0.25 mg by mouth as needed.    . Budesonide (PULMICORT FLEXHALER IN) Inhale into the lungs.    Marland Kitchen ibuprofen (ADVIL,MOTRIN) 800 MG tablet Take 800 mg by mouth  as needed.    Marland Kitchen PULMICORT FLEXHALER 180 MCG/ACT inhaler INHALE 1 INHALATION INTO THE LUNGS 2 (TWO) TIMES DAILY.  3  . triamcinolone (NASACORT) 55 MCG/ACT AERO nasal inhaler Place 1 spray into the nose daily.     No current facility-administered medications for this visit.     Review of Systems  Constitutional: Negative for chills, diaphoresis (improved), fever, malaise/fatigue and weight loss (down 3 pounds).       She feels "like myself again".  HENT: Negative.  Negative for congestion, ear discharge, ear pain, nosebleeds, sinus pain and sore throat.        Hair thinning.  Eyes:  Negative.  Negative for double vision, pain and redness.       Floaters and "flashy things" in RIGHT eye; followed by opthalmology.  Respiratory: Negative.  Negative for cough, hemoptysis, sputum production and shortness of breath.   Cardiovascular: Negative.  Negative for chest pain, palpitations, orthopnea, leg swelling and PND.  Gastrointestinal: Negative.  Negative for abdominal pain, blood in stool, constipation, diarrhea, melena, nausea and vomiting.  Genitourinary: Negative.  Negative for dysuria, frequency, hematuria and urgency.  Musculoskeletal: Positive for joint pain (right hip s/p surgery) and myalgias ("hot needles" sensation to muscles, improved). Negative for back pain and falls.  Skin: Negative.  Negative for itching and rash.  Neurological: Negative for dizziness, tremors, speech change, focal weakness, weakness and headaches.  Endo/Heme/Allergies: Negative.  Does not bruise/bleed easily.  Psychiatric/Behavioral: Negative for depression and memory loss (brain fog, better). The patient is not nervous/anxious and does not have insomnia.   All other systems reviewed and are negative.  Performance status (ECOG): 1  Vital Signs BP 133/62   Pulse 83   Temp (!) 97.3 F (36.3 C) (Tympanic)   Resp 18   Wt 195 lb 15.8 oz (88.9 kg)   SpO2 100%   BMI 34.72 kg/m   Physical Exam  Constitutional: She is oriented to person, place, and time and well-developed, well-nourished, and in no distress.  HENT:  Head: Normocephalic and atraumatic. Hair is abnormal (thinning).  Mouth/Throat: No oropharyngeal exudate.  Long brown hair.  Eyes: Pupils are equal, round, and reactive to light. Conjunctivae and EOM are normal. No scleral icterus.  Glasses.  Brown eyes.  Neck: Normal range of motion. Neck supple. No JVD present.  Cardiovascular: Normal rate, regular rhythm and normal heart sounds. Exam reveals no gallop and no friction rub.  No murmur heard. Pulmonary/Chest: Effort normal and  breath sounds normal. No respiratory distress. She has no wheezes. She has no rales.  Abdominal: Soft. Bowel sounds are normal. She exhibits no distension and no mass. There is no abdominal tenderness. There is no rebound and no guarding.  Musculoskeletal: Normal range of motion.        General: No tenderness or edema.  Lymphadenopathy:    She has no cervical adenopathy.    She has no axillary adenopathy.       Right: No inguinal and no supraclavicular adenopathy present.       Left: No inguinal and no supraclavicular adenopathy present.  Neurological: She is alert and oriented to person, place, and time. Gait normal.  Skin: Skin is warm and dry. No rash noted. No erythema. No pallor.  Psychiatric: Affect and judgment normal.  Nursing note and vitals reviewed.   Appointment on 08/20/2018  Component Date Value Ref Range Status  . Sodium 08/20/2018 139  135 - 145 mmol/L Final  . Potassium 08/20/2018 4.3  3.5 -  5.1 mmol/L Final  . Chloride 08/20/2018 103  98 - 111 mmol/L Final  . CO2 08/20/2018 29  22 - 32 mmol/L Final  . Glucose, Bld 08/20/2018 102* 70 - 99 mg/dL Final  . BUN 08/20/2018 17  6 - 20 mg/dL Final  . Creatinine, Ser 08/20/2018 0.51  0.44 - 1.00 mg/dL Final  . Calcium 08/20/2018 9.1  8.9 - 10.3 mg/dL Final  . Total Protein 08/20/2018 7.2  6.5 - 8.1 g/dL Final  . Albumin 08/20/2018 4.1  3.5 - 5.0 g/dL Final  . AST 08/20/2018 19  15 - 41 U/L Final  . ALT 08/20/2018 27  0 - 44 U/L Final  . Alkaline Phosphatase 08/20/2018 65  38 - 126 U/L Final  . Total Bilirubin 08/20/2018 0.8  0.3 - 1.2 mg/dL Final  . GFR calc non Af Amer 08/20/2018 >60  >60 mL/min Final  . GFR calc Af Amer 08/20/2018 >60  >60 mL/min Final  . Anion gap 08/20/2018 7  5 - 15 Final   Performed at [facility], [address] [place], [place] [address]  . WBC 08/20/2018 7.1  4.0 - 10.5 K/uL Final  . RBC 08/20/2018 4.54  3.87 - 5.11 MIL/uL Final  . Hemoglobin 08/20/2018 14.0  12.0 - 15.0 g/dL  Final  . HCT 08/20/2018 42.1  36.0 - 46.0 % Final  . MCV 08/20/2018 92.7  80.0 - 100.0 fL Final  . MCH 08/20/2018 30.8  26.0 - 34.0 pg Final  . MCHC 08/20/2018 33.3  30.0 - 36.0 g/dL Final  . RDW 08/20/2018 14.3  11.5 - 15.5 % Final  . Platelets 08/20/2018 251  150 - 400 K/uL Final  . nRBC 08/20/2018 0.0  0.0 - 0.2 % Final  . Neutrophils Relative % 08/20/2018 54  % Final  . Neutro Abs 08/20/2018 3.8  1.7 - 7.7 K/uL Final  . Lymphocytes Relative 08/20/2018 36  % Final  . Lymphs Abs 08/20/2018 2.5  0.7 - 4.0 K/uL Final  . Monocytes Relative 08/20/2018 7  % Final  . Monocytes Absolute 08/20/2018 0.5  0.1 - 1.0 K/uL Final  . Eosinophils Relative 08/20/2018 3  % Final  . Eosinophils Absolute 08/20/2018 0.2  0.0 - 0.5 K/uL Final  . Basophils Relative 08/20/2018 0  % Final  . Basophils Absolute 08/20/2018 0.0  0.0 - 0.1 K/uL Final  . Immature Granulocytes 08/20/2018 0  % Final  . Abs Immature Granulocytes 08/20/2018 0.02  0.00 - 0.07 K/uL Final   Performed at [facility], [address] [place], [place] [address]    Assessment:  Jeanne Anderson is a 58 y.o. female with a monoclonal gammopathy of unknown significance (MGUS).  She was noted to have a small monoclonal protein in 11/2016.  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  She was diagnosed with myalgic encephalomyelitis.  Head MRI on 12/06/2016 revealed no specific findings of multiple sclerosis. There were not multiple small nonspecific scattered foci of increased T2 weighted signal within the subcortical and deep white matter likely indicating chronic microvascular ischemic changes or sequela due to migraine headaches.  CBC on 11/18/2016 revealed a hematocrit of 44.6, hemoglobin 15, MCV 91, platelets 283,000, white count 9200, and a normal differential.  SPEP revealed a 0.1 gm/dL IgM lambda monoclonal protein.  Normal studies included: B12, TSH.  Hepatitis C testing  and HIV testing were negative on 08/29/2016.  ANA was negative on 02/10/2015.  Rheumatoid factor  was negative on 12/13/2006.    Work-up on 12/28/2016 revealed a hematocrit of 44.3, hemoglobin 14.9, MCV 90.3, platelets 254,000, white count 9500 with an [place] of 5600.  SPEP revealed a 0.2 g/dL IgM monoclonal protein with lambda light chain specificity.  IgG was 677 (low), IgA 72 (low), and IgM 228 (26-217).  Free light chain ratio was normal.  24-hour urine protein was less than 4.0 mg/dL. There was no monoclonal protein.  Immunofixation was normal.  Normal studies on 12/28/2016: TSH, serum viscosity, LDH, and beta-2 microglobulin.  Paraneoplastic panel was negative (anti-Hu antibodies, anti-Ri antibodies).  PET scan on 01/12/2017 revealed no evidence of hypermetabolic neoplastic disease.  She declined bone marrow biopsy.  SPEP has been followed: 0.1 on 11/18/2016, 0.2 on 12/28/2016, 0.2 on 07/20/2017, 0.1 on 07/20/2017, 0.2 on 02/14/2018, and 0.2 on 08/20/2018.  She was diagnosed with myalgic encephalomyelitis.  She is followed by Dr. Nigel Bridgeman at [facility].  Symptomatically, she is doing well.  She is recovering well from RIGHT hip surgery.  She is eating well; weight up 1 pound. Exam is grossly unremarkable.   Plan: 1. Labs today: CBC with diff, CMP, SPEP, immunoglobulins. 2. Monoclonal gammopathy of unknown significance (MGUS) Clinically doing well. M-spike is stable - 0.2 gm/dL. No bone marrow sent from right hip surgery. Review 1% annual risk of transformation into a lymphoproliferative disorder such as multiple myeloma.  Discuss consideration of fad pad biopsy to r/o amyloid.  Patient "wants to ponder". 3. Myalgic encheplomyelitis Clinically stable. Patient follow up with neurology. 4. RTC in 6 months for MD assessment and labs (CBc with diff, CMP, SPEP, immunoglobulins).    Honor Loh, NP 08/20/2018, 9:23 AM   I saw and evaluated the patient, participating in the key portions of the service  and reviewing pertinent diagnostic studies and records.  I reviewed the nurse practitioner's note and agree with the findings and the plan.  The assessment and plan were discussed with the patient.  Several questions were asked by the patient and answered.   Nolon Stalls, MD 08/20/2018,9:23 AM

## 2018-08-20 ENCOUNTER — Inpatient Hospital Stay: Payer: BLUE CROSS/BLUE SHIELD | Attending: Hematology and Oncology

## 2018-08-20 ENCOUNTER — Inpatient Hospital Stay: Payer: BLUE CROSS/BLUE SHIELD | Admitting: Hematology and Oncology

## 2018-08-20 ENCOUNTER — Encounter: Payer: Self-pay | Admitting: Hematology and Oncology

## 2018-08-20 VITALS — BP 133/62 | HR 83 | Temp 97.3°F | Resp 18 | Wt 196.0 lb

## 2018-08-20 DIAGNOSIS — M797 Fibromyalgia: Secondary | ICD-10-CM

## 2018-08-20 DIAGNOSIS — D472 Monoclonal gammopathy: Secondary | ICD-10-CM | POA: Insufficient documentation

## 2018-08-20 DIAGNOSIS — Z7951 Long term (current) use of inhaled steroids: Secondary | ICD-10-CM

## 2018-08-20 DIAGNOSIS — Z791 Long term (current) use of non-steroidal anti-inflammatories (NSAID): Secondary | ICD-10-CM

## 2018-08-20 DIAGNOSIS — M1990 Unspecified osteoarthritis, unspecified site: Secondary | ICD-10-CM | POA: Insufficient documentation

## 2018-08-20 DIAGNOSIS — Z79899 Other long term (current) drug therapy: Secondary | ICD-10-CM | POA: Diagnosis not present

## 2018-08-20 DIAGNOSIS — F419 Anxiety disorder, unspecified: Secondary | ICD-10-CM

## 2018-08-20 DIAGNOSIS — R933 Abnormal findings on diagnostic imaging of other parts of digestive tract: Secondary | ICD-10-CM | POA: Diagnosis not present

## 2018-08-20 DIAGNOSIS — R61 Generalized hyperhidrosis: Secondary | ICD-10-CM | POA: Insufficient documentation

## 2018-08-20 DIAGNOSIS — R29818 Other symptoms and signs involving the nervous system: Secondary | ICD-10-CM | POA: Diagnosis not present

## 2018-08-20 DIAGNOSIS — G933 Postviral fatigue syndrome: Secondary | ICD-10-CM | POA: Insufficient documentation

## 2018-08-20 DIAGNOSIS — M791 Myalgia, unspecified site: Secondary | ICD-10-CM

## 2018-08-20 LAB — CBC WITH DIFFERENTIAL/PLATELET
Abs Immature Granulocytes: 0.02 10*3/uL (ref 0.00–0.07)
Basophils Absolute: 0 10*3/uL (ref 0.0–0.1)
Basophils Relative: 0 %
Eosinophils Absolute: 0.2 10*3/uL (ref 0.0–0.5)
Eosinophils Relative: 3 %
HCT: 42.1 % (ref 36.0–46.0)
Hemoglobin: 14 g/dL (ref 12.0–15.0)
Immature Granulocytes: 0 %
Lymphocytes Relative: 36 %
Lymphs Abs: 2.5 10*3/uL (ref 0.7–4.0)
MCH: 30.8 pg (ref 26.0–34.0)
MCHC: 33.3 g/dL (ref 30.0–36.0)
MCV: 92.7 fL (ref 80.0–100.0)
Monocytes Absolute: 0.5 10*3/uL (ref 0.1–1.0)
Monocytes Relative: 7 %
Neutro Abs: 3.8 10*3/uL (ref 1.7–7.7)
Neutrophils Relative %: 54 %
Platelets: 251 10*3/uL (ref 150–400)
RBC: 4.54 MIL/uL (ref 3.87–5.11)
RDW: 14.3 % (ref 11.5–15.5)
WBC: 7.1 10*3/uL (ref 4.0–10.5)
nRBC: 0 % (ref 0.0–0.2)

## 2018-08-20 LAB — COMPREHENSIVE METABOLIC PANEL
ALT: 27 U/L (ref 0–44)
AST: 19 U/L (ref 15–41)
Albumin: 4.1 g/dL (ref 3.5–5.0)
Alkaline Phosphatase: 65 U/L (ref 38–126)
Anion gap: 7 (ref 5–15)
BUN: 17 mg/dL (ref 6–20)
CO2: 29 mmol/L (ref 22–32)
Calcium: 9.1 mg/dL (ref 8.9–10.3)
Chloride: 103 mmol/L (ref 98–111)
Creatinine, Ser: 0.51 mg/dL (ref 0.44–1.00)
GFR calc Af Amer: >60 mL/min (ref >60)
GFR calc non Af Amer: >60 mL/min (ref >60)
Glucose, Bld: 102 mg/dL — ABNORMAL HIGH (ref 70–99)
Potassium: 4.3 mmol/L (ref 3.5–5.1)
Sodium: 139 mmol/L (ref 135–145)
Total Bilirubin: 0.8 mg/dL (ref 0.3–1.2)
Total Protein: 7.2 g/dL (ref 6.5–8.1)

## 2018-08-20 NOTE — Progress Notes (Signed)
No new changes noted today 

## 2018-08-21 LAB — MULTIPLE MYELOMA PANEL, SERUM
Albumin SerPl Elph-Mcnc: 3.6 g/dL (ref 2.9–4.4)
Albumin/Glob SerPl: 1.3 (ref 0.7–1.7)
Alpha 1: 0.2 g/dL (ref 0.0–0.4)
Alpha2 Glob SerPl Elph-Mcnc: 0.8 g/dL (ref 0.4–1.0)
B-Globulin SerPl Elph-Mcnc: 1 g/dL (ref 0.7–1.3)
Gamma Glob SerPl Elph-Mcnc: 0.7 g/dL (ref 0.4–1.8)
Globulin, Total: 2.8 g/dL (ref 2.2–3.9)
IgA: 63 mg/dL — ABNORMAL LOW (ref 87–352)
IgG (Immunoglobin G), Serum: 635 mg/dL — ABNORMAL LOW (ref 700–1600)
IgM (Immunoglobulin M), Srm: 229 mg/dL — ABNORMAL HIGH (ref 26–217)
M Protein SerPl Elph-Mcnc: 0.2 g/dL — ABNORMAL HIGH
Total Protein ELP: 6.4 g/dL (ref 6.0–8.5)

## 2019-01-07 ENCOUNTER — Encounter: Payer: BC Managed Care – PPO | Attending: Family Medicine | Admitting: Dietician

## 2019-01-07 ENCOUNTER — Other Ambulatory Visit: Payer: Self-pay

## 2019-01-07 ENCOUNTER — Encounter: Payer: Self-pay | Admitting: Dietician

## 2019-01-07 VITALS — Ht 62.0 in | Wt 202.8 lb

## 2019-01-07 DIAGNOSIS — R635 Abnormal weight gain: Secondary | ICD-10-CM
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
## 2019-01-07 NOTE — Patient Instructions (Addendum)
Balance meals with 1-3 oz protein, 2 servings of carbohydrate and non-starchy vegetables. Spread 8-9 servings of carbohydrate over 3 meals and 1-2 snacks. Include at least 6 oz protein daily. Set aside time for meals rather than continuing to work while eating. Work to establish a meal pattern of 3 meals spaced 4-5 hours apart. Exercise goal: walk 1 mile per day per PT -, 5-6 days per week. Record food/beverage intake for at least 1 week on myfitnesspal app.

## 2019-02-05 ENCOUNTER — Encounter: Payer: BC Managed Care – PPO | Attending: Family Medicine | Admitting: Dietician

## 2019-02-05 ENCOUNTER — Other Ambulatory Visit: Payer: Self-pay

## 2019-02-05 VITALS — Ht 62.0 in | Wt 198.0 lb

## 2019-02-05 DIAGNOSIS — R635 Abnormal weight gain: Secondary | ICD-10-CM

## 2019-02-05 NOTE — Progress Notes (Signed)
Medical Nutrition Therapy Follow-up visit:  Time with patient: 10:30-11:30 am Visit #: 2 ASSESSMENT:  Diagnosis: abnormal weight gain  Current weight: 198 lbs    Height: 62 in Medications: See list Medical History: chronic fatigue syndrome  Progress and evaluation: Patient reports that she is using myfitness pal to monitor her food/beverage intake. She has nutrient goals set as 114 gms protein, 114 gms carbohydrate and 43 gms fat. She states she is rarely able to meet the protein goal she has set. She is starting her day with a skim milk latte and cheddar/bacon scone at at local coffee shop where she has resumed "writing". She is  controlling portions better at her dinner meals. She has increased her fruit intake to 2 servings daily typically for snacks. Her lunch and dinner meals are prepared at home. She states she tries to go to bed by 11:00pm rather than staying up well past midnight as was previously doing. She drinks 3-4 (18 oz) waters daily. Her weight today is 4.8 lbs less than her weight 1 month ago.    Physical activity: walks 6 days/week for 30 minutes  NUTRITION CARE EDUCATION:   Weight control:  Commended on positive diet changes she has made and also on setting an earlier bedtime. Discussed how starting her day with more structure such as writing sets the tone for the day. Discussed rate of weight loss and how goal of 3/4 to 1 lb/ week is recommended rather than a faster rate. Encouraged continued exercise to help decrease weight loss plateaus. Obtained 24 hour recall and compared intake to food group servings needed to meet nutrient needs. Also, discussed a better goal balance of carbohydrate, protein and fat when monitoring her food intake.  INTERVENTION:  Continue to monitor food/beverage intake. 1300 calories; 70 gms protein,146 gms carbohydrate.45 gms fat Increase non-starchy vegetables. Refer to list. Continue exercise: walking, 30 minutes-6 days per  week.  EDUCATION MATERIALS GIVEN:  . Goals/ instructions  LEARNER/ who was taught:  . Patient   LEVEL OF UNDERSTANDING: . Verbalizes/ demonstrates competency Demonstrated degree of understanding via teach back.  LEARNING BARRIERS: . None  WILLINGNESS TO LEARN/READINESS FOR CHANGE:  Change in progress MONITORING AND EVALUATION:   Weight, diet, exercise Follow up: 03/05/19 at 9:00am

## 2019-02-05 NOTE — Patient Instructions (Signed)
Continue to monitor food/beverage intake. 1300 calories; 70 gms protein,146 gms carbohydrate.45 gms fat Increase non-starchy vegetables. Refer to list. Continue exercise: walking, 30 minutes-6 days per week.

## 2019-02-15 NOTE — Progress Notes (Signed)
[facility]  [address], [address] [place], [place] [address] Phone: [phone]  Fax: [phone]   Clinic Day:  02/18/2019  Referring physician: Hortencia Pilar, MD  Chief Complaint: Jeanne Anderson is a 58 y.o. female with a monoclonal gammopathy of unknown significance (MGUS) who is seen for 6 month assesment.  HPI: The patient was last seen in the medical oncology clinic on 08/20/2018. At that time, she was doing well.  She was recovering well from right hip surgery. She was eating well; weight up 1 pound. Exam was grossly unremarkable.   Bilateral screening mammogram on 09/19/2018 revealed no evidence of malignancy.  Labs on 12/21/2018: hematocrit 43.1, hemoglobin 14.1, MCV 92, platelets 230,000, WBC 7,700. TSH 2.25, free T4 0.80.   She has been followed in nutrition by Karolee Stamps, RD for weight gain following hip replacement last fall.   During the interim, she is doing well. Her right hip is feeling great, but she reports new left hip pain. She has plantar fascitis in her right foot.   She reports has been losing about 1lb a week. It has been difficult for her to increase her calories from 800 to 1300 per day.    Past Medical History:  Diagnosis Date   Anxiety    Arthritis    Asthma    Dizziness    Fibromyalgia    History of Epstein-Barr virus infection    Memory changes    Muscular pain    Numbness and tingling    Vision changes     Past Surgical History:  Procedure Laterality Date   ABDOMINAL HYSTERECTOMY     COLONOSCOPY  2016   HIP ARTHROPLASTY      Family History  Problem Relation Age of Onset   Cancer Mother    Leukemia Sister    Breast cancer Sister     Social History:  reports that she has never smoked. She has never used smokeless tobacco. She reports current alcohol use. She reports that she does not use drugs. She denies any exposure to radiation or toxins.  She is a [employer]chant [employer] officer.  She  lives in [place].  The patient is alone today.  Allergies:  Allergies  Allergen Reactions   Lidocaine     Other reaction(s): Unknown Weird things go numb per pt.    Meloxicam     Other reaction(s): Other (See Comments) Other Reaction: OTHER REACTION    Current Medications: Current Outpatient Medications  Medication Sig Dispense Refill   albuterol (PROVENTIL) (2.5 MG/3ML) 0.083% nebulizer solution Inhale 2.5 mg into the lungs every 4 (four) hours as needed.      ALPRAZolam (XANAX) 0.25 MG tablet Take 0.25 mg by mouth as needed.     Budesonide (PULMICORT FLEXHALER IN) Inhale 1 puff into the lungs daily.      Cholecalciferol (VITAMIN D3) 5000 units CAPS Take 5,000 Units by mouth daily.     gabapentin (NEURONTIN) 300 MG capsule Take 300 mg by mouth daily.   4   ibuprofen (ADVIL,MOTRIN) 800 MG tablet Take 800 mg by mouth as needed.     triamcinolone (NASACORT) 55 MCG/ACT AERO nasal inhaler Place 1 spray into the nose daily.     vitamin B-12 (CYANOCOBALAMIN) 1000 MCG tablet Take 1,000 mcg by mouth daily.     No current facility-administered medications for this visit.     Review of Systems  Constitutional: Negative for chills, diaphoresis (improved), fever, malaise/fatigue and weight loss (up 2lbs).  Feels "alright".  HENT: Negative.  Negative for congestion, ear pain, hearing loss, sinus pain and sore throat.        Hair thinning.  Eyes: Negative.  Negative for blurred vision, double vision, pain and redness.       Floaters and "flashy things" in RIGHT eye; followed by opthalmology.  Respiratory: Negative.  Negative for cough, shortness of breath and wheezing.   Cardiovascular: Negative.  Negative for chest pain, palpitations, orthopnea, leg swelling and PND.  Gastrointestinal: Negative.  Negative for abdominal pain, blood in stool, constipation, diarrhea, melena, nausea and vomiting.  Genitourinary: Negative.  Negative for dysuria, frequency, hematuria and urgency.    Musculoskeletal: Positive for joint pain (left hip pain) and myalgias ("hot needles" sensation to muscles, improved). Negative for back pain and falls.       S/p right hip surgery.  Plantar fascitis, right foot  Skin: Negative.  Negative for itching and rash.  Neurological: Negative for dizziness, tremors, speech change, focal weakness, weakness and headaches.  Endo/Heme/Allergies: Negative.  Does not bruise/bleed easily.  Psychiatric/Behavioral: Negative for depression and memory loss. The patient is not nervous/anxious and does not have insomnia.   All other systems reviewed and are negative.  Performance status (ECOG): 1  Vitals Blood pressure (!) 138/59, pulse 69, temperature 98.5 F (36.9 C), temperature source Tympanic, resp. rate 16, weight 197 lb 13.8 oz (89.8 kg), SpO2 100 %.   Physical Exam  Constitutional: She is oriented to person, place, and time. She appears well-developed and well-nourished. No distress.  HENT:  Head: Normocephalic and atraumatic.  Mouth/Throat: Oropharynx is clear and moist. No oropharyngeal exudate.  Long dark brown hair. Hair thinning. Wearing a mask.  Eyes: Pupils are equal, round, and reactive to light. Conjunctivae and EOM are normal. No scleral icterus.  Glasses.  Brown eyes.   Neck: Normal range of motion. Neck supple. No JVD present.  Cardiovascular: Normal rate, regular rhythm and normal heart sounds.  No murmur heard. Pulmonary/Chest: Effort normal and breath sounds normal. No respiratory distress. She has no wheezes.  Abdominal: Soft. Bowel sounds are normal. She exhibits no distension. There is no abdominal tenderness. There is no rebound and no guarding.  Musculoskeletal: Normal range of motion.        General: Tenderness (BLE) present. No edema.  Lymphadenopathy:    She has no cervical adenopathy.    She has no axillary adenopathy.       Right: No supraclavicular adenopathy present.       Left: No supraclavicular adenopathy present.   Neurological: She is alert and oriented to person, place, and time.  Skin: Skin is warm and dry. She is not diaphoretic. No erythema.  Psychiatric: She has a normal mood and affect. Her behavior is normal. Judgment and thought content normal.  Nursing note and vitals reviewed.   Appointment on 02/18/2019  Component Date Value Ref Range Status   Sodium 02/18/2019 137  135 - 145 mmol/L Final   Potassium 02/18/2019 4.3  3.5 - 5.1 mmol/L Final   Chloride 02/18/2019 103  98 - 111 mmol/L Final   CO2 02/18/2019 25  22 - 32 mmol/L Final   Glucose, Bld 02/18/2019 110* 70 - 99 mg/dL Final   BUN 02/18/2019 10  6 - 20 mg/dL Final   Creatinine, Ser 02/18/2019 0.58  0.44 - 1.00 mg/dL Final   Calcium 02/18/2019 9.1  8.9 - 10.3 mg/dL Final   Total Protein 02/18/2019 7.1  6.5 - 8.1 g/dL Final  Albumin 02/18/2019 4.4  3.5 - 5.0 g/dL Final   AST 02/18/2019 21  15 - 41 U/L Final   ALT 02/18/2019 29  0 - 44 U/L Final   Alkaline Phosphatase 02/18/2019 58  38 - 126 U/L Final   Total Bilirubin 02/18/2019 0.6  0.3 - 1.2 mg/dL Final   GFR calc non Af Amer 02/18/2019 >60  >60 mL/min Final   GFR calc Af Amer 02/18/2019 >60  >60 mL/min Final   Anion gap 02/18/2019 9  5 - 15 Final   Performed at [facility], [address], [place], [place] [address]   WBC 02/18/2019 6.8  4.0 - 10.5 K/uL Final   RBC 02/18/2019 4.76  3.87 - 5.11 MIL/uL Final   Hemoglobin 02/18/2019 14.4  12.0 - 15.0 g/dL Final   HCT 02/18/2019 43.4  36.0 - 46.0 % Final   MCV 02/18/2019 91.2  80.0 - 100.0 fL Final   MCH 02/18/2019 30.3  26.0 - 34.0 pg Final   MCHC 02/18/2019 33.2  30.0 - 36.0 g/dL Final   RDW 02/18/2019 13.9  11.5 - 15.5 % Final   Platelets 02/18/2019 240  150 - 400 K/uL Final   nRBC 02/18/2019 0.0  0.0 - 0.2 % Final   Neutrophils Relative % 02/18/2019 56  % Final   Neutro Abs 02/18/2019 3.8  1.7 - 7.7 K/uL Final   Lymphocytes Relative 02/18/2019 34  % Final   Lymphs Abs  02/18/2019 2.3  0.7 - 4.0 K/uL Final   Monocytes Relative 02/18/2019 7  % Final   Monocytes Absolute 02/18/2019 0.5  0.1 - 1.0 K/uL Final   Eosinophils Relative 02/18/2019 2  % Final   Eosinophils Absolute 02/18/2019 0.2  0.0 - 0.5 K/uL Final   Basophils Relative 02/18/2019 1  % Final   Basophils Absolute 02/18/2019 0.0  0.0 - 0.1 K/uL Final   Immature Granulocytes 02/18/2019 0  % Final   Abs Immature Granulocytes 02/18/2019 0.03  0.00 - 0.07 K/uL Final   Performed at [facility], [address], [place], [place] [address]    Assessment:  Fusaye Wachtel is a 58 y.o. female with a monoclonal gammopathy of unknown significance (MGUS).  She was noted to have a small monoclonal protein in 11/2016.  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  She was diagnosed with myalgic encephalomyelitis.  Head MRI on 12/06/2016 revealed no specific findings of multiple sclerosis. There were not multiple small nonspecific scattered foci of increased T2 weighted signal within the subcortical and deep white matter likely indicating chronic microvascular ischemic changes or sequela due to migraine headaches.  CBC on 11/18/2016 revealed a hematocrit of 44.6, hemoglobin 15, MCV 91, platelets 283,000, white count 9200, and a normal differential.  SPEP revealed a 0.1 gm/dL IgM lambda monoclonal protein.  Normal studies included: B12, TSH.  Hepatitis C testing and HIV testing were negative on 08/29/2016.  ANA was negative on 02/10/2015.  Rheumatoid factor was negative on 12/13/2006.    Work-up on 12/28/2016 revealed a hematocrit of 44.3, hemoglobin 14.9, MCV 90.3, platelets 254,000, white count 9500 with an [place] of 5600. SPEP revealed a 0.2 g/dL IgM monoclonal protein with lambda light chain specificity. IgG was 677 (low), IgA 72 (low), and IgM 228 (26-217). Free light chain ratio was normal. 24-hour urine protein was less than 4.0 mg/dL.  There was no monoclonal protein. Immunofixation was normal.  Normal studies on 12/28/2016: TSH, serum viscosity, LDH,  and beta-2 microglobulin.  Paraneoplastic panel was negative (anti-Hu antibodies, anti-Ri antibodies).  PET scan on 01/12/2017 revealed no evidence of hypermetabolic neoplastic disease.  She declined bone marrow biopsy.  SPEP has been followed: 0.1 on 11/18/2016, 0.2 on 12/28/2016, 0.2 on 07/20/2017, 0.1 on 07/20/2017, 0.2 on 02/14/2018, 0.2 on 08/20/2018, and 0.2 on 02/18/2019.  She was diagnosed with myalgic encephalomyelitis.  She is followed by Dr. Nigel Bridgeman at [facility].  Symptomatically, she is doing well.  She has left hip discomfort.  Exam reveals no adenopathy or hepatosplenomegaly.  Plan: 1.   Labs today: CBC with diff, CMP, myeloma panel.   2.   Monoclonal gammopathy of unknown significance (MGUS) Clinically, she is doing well.   M spike is stable at 0.2 gm/dL.  Continue close surveillance. 3.   RTC in 6 months for MD assessment and labs (CBC with difff, CMP, SPEP, immunoglobulins).   I discussed the assessment and treatment plan with the patient.  The patient was provided an opportunity to ask questions and all were answered.  The patient agreed with the plan and demonstrated an understanding of the instructions.  The patient was advised to call back if the symptoms worsen or if the condition fails to improve as anticipated.  I provided 17 minutes of face-to-face time during this this encounter and > 50% was spent counseling as documented under my assessment and plan.    Lequita Asal, MD, PhD    02/18/2019, 9:33 AM  I, Cloyde Reams Dorshimer, am acting as Education administrator for [employer]. Mike Gip, MD, PhD.  I, Onika Gudiel C. Mike Gip, MD, have reviewed the above documentation for accuracy and completeness, and I agree with the above.

## 2019-02-18 ENCOUNTER — Encounter: Payer: Self-pay | Admitting: Hematology and Oncology

## 2019-02-18 ENCOUNTER — Inpatient Hospital Stay: Payer: BC Managed Care – PPO | Attending: Hematology and Oncology | Admitting: Hematology and Oncology

## 2019-02-18 ENCOUNTER — Other Ambulatory Visit: Payer: Self-pay

## 2019-02-18 ENCOUNTER — Inpatient Hospital Stay: Payer: BC Managed Care – PPO

## 2019-02-18 VITALS — BP 138/59 | HR 69 | Temp 98.5°F | Resp 16 | Wt 197.9 lb

## 2019-02-18 DIAGNOSIS — Z791 Long term (current) use of non-steroidal anti-inflammatories (NSAID): Secondary | ICD-10-CM | POA: Diagnosis not present

## 2019-02-18 DIAGNOSIS — Z7951 Long term (current) use of inhaled steroids: Secondary | ICD-10-CM | POA: Diagnosis not present

## 2019-02-18 DIAGNOSIS — Z806 Family history of leukemia: Secondary | ICD-10-CM | POA: Diagnosis not present

## 2019-02-18 DIAGNOSIS — D472 Monoclonal gammopathy: Secondary | ICD-10-CM

## 2019-02-18 DIAGNOSIS — M722 Plantar fascial fibromatosis: Secondary | ICD-10-CM | POA: Insufficient documentation

## 2019-02-18 DIAGNOSIS — M1990 Unspecified osteoarthritis, unspecified site: Secondary | ICD-10-CM | POA: Diagnosis not present

## 2019-02-18 DIAGNOSIS — M797 Fibromyalgia: Secondary | ICD-10-CM | POA: Diagnosis not present

## 2019-02-18 DIAGNOSIS — Z803 Family history of malignant neoplasm of breast: Secondary | ICD-10-CM | POA: Diagnosis not present

## 2019-02-18 DIAGNOSIS — Z79899 Other long term (current) drug therapy: Secondary | ICD-10-CM | POA: Insufficient documentation

## 2019-02-18 DIAGNOSIS — F419 Anxiety disorder, unspecified: Secondary | ICD-10-CM | POA: Diagnosis not present

## 2019-02-18 DIAGNOSIS — J45909 Unspecified asthma, uncomplicated: Secondary | ICD-10-CM | POA: Insufficient documentation

## 2019-02-18 DIAGNOSIS — M25552 Pain in left hip: Secondary | ICD-10-CM | POA: Diagnosis not present

## 2019-02-18 LAB — CBC WITH DIFFERENTIAL/PLATELET
Abs Immature Granulocytes: 0.03 10*3/uL (ref 0.00–0.07)
Basophils Absolute: 0 10*3/uL (ref 0.0–0.1)
Basophils Relative: 1 %
Eosinophils Absolute: 0.2 10*3/uL (ref 0.0–0.5)
Eosinophils Relative: 2 %
HCT: 43.4 % (ref 36.0–46.0)
Hemoglobin: 14.4 g/dL (ref 12.0–15.0)
Immature Granulocytes: 0 %
Lymphocytes Relative: 34 %
Lymphs Abs: 2.3 10*3/uL (ref 0.7–4.0)
MCH: 30.3 pg (ref 26.0–34.0)
MCHC: 33.2 g/dL (ref 30.0–36.0)
MCV: 91.2 fL (ref 80.0–100.0)
Monocytes Absolute: 0.5 10*3/uL (ref 0.1–1.0)
Monocytes Relative: 7 %
Neutro Abs: 3.8 10*3/uL (ref 1.7–7.7)
Neutrophils Relative %: 56 %
Platelets: 240 10*3/uL (ref 150–400)
RBC: 4.76 MIL/uL (ref 3.87–5.11)
RDW: 13.9 % (ref 11.5–15.5)
WBC: 6.8 10*3/uL (ref 4.0–10.5)
nRBC: 0 % (ref 0.0–0.2)

## 2019-02-18 LAB — COMPREHENSIVE METABOLIC PANEL
ALT: 29 U/L (ref 0–44)
AST: 21 U/L (ref 15–41)
Albumin: 4.4 g/dL (ref 3.5–5.0)
Alkaline Phosphatase: 58 U/L (ref 38–126)
Anion gap: 9 (ref 5–15)
BUN: 10 mg/dL (ref 6–20)
CO2: 25 mmol/L (ref 22–32)
Calcium: 9.1 mg/dL (ref 8.9–10.3)
Chloride: 103 mmol/L (ref 98–111)
Creatinine, Ser: 0.58 mg/dL (ref 0.44–1.00)
GFR calc Af Amer: >60 mL/min (ref >60)
GFR calc non Af Amer: >60 mL/min (ref >60)
Glucose, Bld: 110 mg/dL — ABNORMAL HIGH (ref 70–99)
Potassium: 4.3 mmol/L (ref 3.5–5.1)
Sodium: 137 mmol/L (ref 135–145)
Total Bilirubin: 0.6 mg/dL (ref 0.3–1.2)
Total Protein: 7.1 g/dL (ref 6.5–8.1)

## 2019-02-18 NOTE — Progress Notes (Signed)
Pt here for follow up. Denies any concerns at this time.  

## 2019-02-20 LAB — MULTIPLE MYELOMA PANEL, SERUM
Albumin SerPl Elph-Mcnc: 3.9 g/dL (ref 2.9–4.4)
Albumin/Glob SerPl: 1.5 (ref 0.7–1.7)
Alpha 1: 0.2 g/dL (ref 0.0–0.4)
Alpha2 Glob SerPl Elph-Mcnc: 0.8 g/dL (ref 0.4–1.0)
B-Globulin SerPl Elph-Mcnc: 1.1 g/dL (ref 0.7–1.3)
Gamma Glob SerPl Elph-Mcnc: 0.7 g/dL (ref 0.4–1.8)
Globulin, Total: 2.7 g/dL (ref 2.2–3.9)
IgA: 67 mg/dL — ABNORMAL LOW (ref 87–352)
IgG (Immunoglobin G), Serum: 679 mg/dL (ref 586–1602)
IgM (Immunoglobulin M), Srm: 214 mg/dL (ref 26–217)
M Protein SerPl Elph-Mcnc: 0.2 g/dL — ABNORMAL HIGH
Total Protein ELP: 6.6 g/dL (ref 6.0–8.5)

## 2019-03-05 ENCOUNTER — Ambulatory Visit: Payer: BC Managed Care – PPO | Admitting: Dietician

## 2019-03-14 ENCOUNTER — Encounter: Payer: Self-pay | Admitting: Dietician

## 2019-08-15 NOTE — Progress Notes (Signed)
[facility]  [address], [address] [place], [place] [address] Phone: [phone]  Fax: [phone]   Telemedicine Office Visit:  08/19/2019  Referring physician: Hortencia Pilar, MD  I connected with Gaspar Garbe on 08/19/2019 at 9:51 AM by videoconferencing and verified that I was speaking with the correct person using 2 identifiers.  The patient was at home.  I discussed the limitations, risk, security and privacy concerns of performing an evaluation and management service by videoconferencing and the availability of in person appointments.  I also discussed with the patient that there may be a patient responsible charge related to this service.  The patient expressed understanding and agreed to proceed.   Chief Complaint: Jeanne Anderson is a 59 y.o. female with a monoclonal gammopathy of unknown significance (MGUS) who is seen for 6 month assessment.  HPI: The patient was last seen in the medical oncology clinic on 02/18/2019. At that time, she was doing well.  She had left hip discomfort.  Exam revealed no adenopathy or hepatosplenomegaly. Labs were normal. IgA was 67. M spike was 0.2 gm/dL. Surveillance continued.   She had a telemedicine visit in neurology with Dr. Nigel Bridgeman on 06/05/2019.  She has myalgic encephalomyelopathy.  She was doing well on her current dosage of gabapentin.  She will follow up in 12 months.   Labs on 08/16/2019: Hematocrit 44.0, hemoglobin 14.2, platelets 250,000, WBC 7,400. CMP was normal. Multiple myeloma panel is pending.  During the interim, she has felt "ok". She continues to have left hip pain secondary to arthritis. She remains active and takes daily walks. She is stretching daily to relieve pain. She has trouble focusing at times. The patient is unable to concentrate for more than 4 hours. 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   She denies any B symptoms. She continues to have floaters  and "flashy things" in her right eye. The plantar fascitis in her right foot has improved. She continues to have hair thinning. She notes her temperature remains low around 95 degrees fahrenheit. She notes breast pain. She denies skin changes, nipple discharge or bleeding.    Past Medical History:  Diagnosis Date  . Anxiety   . Arthritis   . Asthma   . Dizziness   . Fibromyalgia   . History of Epstein-Barr virus infection   . Memory changes   . Muscular pain   . Numbness and tingling   . Vision changes     Past Surgical History:  Procedure Laterality Date  . ABDOMINAL HYSTERECTOMY    . COLONOSCOPY  2016  . HIP ARTHROPLASTY      Family History  Problem Relation Age of Onset  . Cancer Mother   . Leukemia Sister   . Breast cancer Sister     Social History:  reports that she has never smoked. She has never used smokeless tobacco. She reports current alcohol use. She reports that she does not use drugs. She denies any exposure to radiation or toxins. She is a [employer]. She lives in [place]. The patient is alone today.  Participants in the patient's visit and their role in the encounter included the patient and Vito Berger, CMA, today.  The intake visit was provided by Vito Berger, CMA.  Allergies:  Allergies  Allergen Reactions  . Lidocaine     Other reaction(s): Unknown Weird things go numb per pt.   . Meloxicam     Other reaction(s): Other (See  Comments) Other Reaction: OTHER REACTION    Current Medications: Current Outpatient Medications  Medication Sig Dispense Refill  . albuterol (PROVENTIL) (2.5 MG/3ML) 0.083% nebulizer solution Inhale 2.5 mg into the lungs every 4 (four) hours as needed.     . ALPRAZolam (XANAX) 0.25 MG tablet Take 0.25 mg by mouth as needed.    . Budesonide (PULMICORT FLEXHALER IN) Inhale 1 puff into the lungs daily.     . Cholecalciferol (VITAMIN D3) 5000 units CAPS Take 5,000 Units by mouth daily.    Marland Kitchen gabapentin  (NEURONTIN) 300 MG capsule Take 300 mg by mouth daily.   4  . ibuprofen (ADVIL,MOTRIN) 800 MG tablet Take 800 mg by mouth as needed.    . triamcinolone (NASACORT) 55 MCG/ACT AERO nasal inhaler Place 1 spray into the nose daily.    . vitamin B-12 (CYANOCOBALAMIN) 1000 MCG tablet Take 1,000 mcg by mouth daily.     No current facility-administered medications for this visit.    Review of Systems  Constitutional: Negative for chills, diaphoresis, fever (low temperature of 95), malaise/fatigue and weight loss.       Feels "ok".  HENT: Negative.  Negative for congestion, ear pain, hearing loss, nosebleeds, sinus pain and sore throat.        Hair thinning.  Eyes: Negative.  Negative for blurred vision, double vision, pain and redness.       Floaters and "flashy things" in RIGHT eye; followed by opthalmology.  Respiratory: Negative.  Negative for cough, sputum production, shortness of breath and wheezing.   Cardiovascular: Positive for chest pain (breast pain). Negative for palpitations, orthopnea, leg swelling and PND.  Gastrointestinal: Negative.  Negative for abdominal pain, blood in stool, constipation, diarrhea, melena, nausea and vomiting.  Genitourinary: Negative.  Negative for dysuria, frequency, hematuria and urgency.  Musculoskeletal: Positive for joint pain (left hip pain secondary to arthritis). Negative for back pain, falls and myalgias.       S/p right hip surgery.  Plantar fascitis in right foot- resolved.  Skin: Negative.  Negative for itching and rash.  Neurological: Negative.  Negative for dizziness, tremors, speech change, focal weakness, weakness and headaches.  Endo/Heme/Allergies: Negative.  Does not bruise/bleed easily.  Psychiatric/Behavioral: Negative.  Negative for depression and memory loss. The patient is not nervous/anxious and does not have insomnia.   All other systems reviewed and are negative.  Performance status (ECOG): 1  Physical Exam  Constitutional: She is  oriented to person, place, and time. She appears well-developed and well-nourished. No distress.  HENT:  Head: Normocephalic and atraumatic.  Long dark brown hair with thinning.  Eyes: Conjunctivae and EOM are normal. No scleral icterus.  Glasses.  Brown eyes.   Neurological: She is alert and oriented to person, place, and time.  Skin: She is not diaphoretic.  Psychiatric: She has a normal mood and affect. Her behavior is normal. Judgment and thought content normal.  Nursing note reviewed.   No visits with results within 3 Day(s) from this visit.  Latest known visit with results is:  Appointment on 08/16/2019  Component Date Value Ref Range Status  . WBC 08/16/2019 7.4  4.0 - 10.5 K/uL Final  . RBC 08/16/2019 4.73  3.87 - 5.11 MIL/uL Final  . Hemoglobin 08/16/2019 14.2  12.0 - 15.0 g/dL Final  . HCT 08/16/2019 44.0  36.0 - 46.0 % Final  . MCV 08/16/2019 93.0  80.0 - 100.0 fL Final  . MCH 08/16/2019 30.0  26.0 - 34.0 pg Final  .  MCHC 08/16/2019 32.3  30.0 - 36.0 g/dL Final  . RDW 08/16/2019 13.7  11.5 - 15.5 % Final  . Platelets 08/16/2019 250  150 - 400 K/uL Final  . nRBC 08/16/2019 0.0  0.0 - 0.2 % Final  . Neutrophils Relative % 08/16/2019 52  % Final  . Neutro Abs 08/16/2019 3.9  1.7 - 7.7 K/uL Final  . Lymphocytes Relative 08/16/2019 36  % Final  . Lymphs Abs 08/16/2019 2.7  0.7 - 4.0 K/uL Final  . Monocytes Relative 08/16/2019 8  % Final  . Monocytes Absolute 08/16/2019 0.6  0.1 - 1.0 K/uL Final  . Eosinophils Relative 08/16/2019 3  % Final  . Eosinophils Absolute 08/16/2019 0.2  0.0 - 0.5 K/uL Final  . Basophils Relative 08/16/2019 1  % Final  . Basophils Absolute 08/16/2019 0.1  0.0 - 0.1 K/uL Final  . Immature Granulocytes 08/16/2019 0  % Final  . Abs Immature Granulocytes 08/16/2019 0.01  0.00 - 0.07 K/uL Final   Performed at [facility], [address], [place], [place] [address]  . Sodium 08/16/2019 140  135 - 145 mmol/L Final  . Potassium 08/16/2019  4.4  3.5 - 5.1 mmol/L Final  . Chloride 08/16/2019 103  98 - 111 mmol/L Final  . CO2 08/16/2019 26  22 - 32 mmol/L Final  . Glucose, Bld 08/16/2019 102* 70 - 99 mg/dL Final  . BUN 08/16/2019 15  8 - 23 mg/dL Final  . Creatinine, Ser 08/16/2019 0.60  0.44 - 1.00 mg/dL Final  . Calcium 08/16/2019 9.2  8.9 - 10.3 mg/dL Final  . Total Protein 08/16/2019 7.2  6.5 - 8.1 g/dL Final  . Albumin 08/16/2019 4.2  3.5 - 5.0 g/dL Final  . AST 08/16/2019 18  15 - 41 U/L Final  . ALT 08/16/2019 21  0 - 44 U/L Final  . Alkaline Phosphatase 08/16/2019 62  38 - 126 U/L Final  . Total Bilirubin 08/16/2019 0.7  0.3 - 1.2 mg/dL Final  . GFR calc non Af Amer 08/16/2019 >60  >60 mL/min Final  . GFR calc Af Amer 08/16/2019 >60  >60 mL/min Final  . Anion gap 08/16/2019 11  5 - 15 Final   Performed at [facility], [address] [place], [place] [address]    Assessment:  Ethyl Vila is a 59 y.o. female with a monoclonal gammopathy of unknown significance(MGUS). She was noted to have a small monoclonal proteinin 11/2016. She has a constellation of neurologic symptomspossibly related to myalgic encephalomyelitis. She was diagnosed with myalgic encephalomyelitis.  Head MRI on 12/06/2016 revealed no specific findings of multiple sclerosis. There were not multiple small nonspecific scattered foci of increased T2 weighted signal within the subcortical and deep white matter likely indicating chronic microvascular ischemic changes or sequela due to migraine headaches.  CBCon 11/18/2016 revealed a hematocrit of 44.6, hemoglobin 15, MCV 91, platelets 283,000, white count 9200, and a normal differential. SPEP revealed a 0.1 gm/dL IgM lambda monoclonal protein. Normal studies included: B12, TSH. Hepatitis C testing and HIV testing were negative on 08/29/2016. ANA was negative on 02/10/2015. Rheumatoid factor was negative on 12/13/2006.   Work-up on 06/20/2018revealed a hematocrit of 44.3,  hemoglobin 14.9, MCV 90.3, platelets 254,000, white count 9500 with an ANC of 5600. SPEPrevealed a 0.2 g/dL IgM monoclonal protein with lambda light chain specificity. IgG was 677 (low), IgA 72 (low), and IgM 228 (26-217). Free light chain ratio was normal. 24-hour urine protein was less  than 4.0 mg/dL. There was no monoclonal protein. Immunofixation was normal. Normal studies on 12/28/2016: TSH, serum viscosity, LDH, and beta-2 microglobulin. Paraneoplastic panelwas negative (anti-Hu antibodies, anti-Ri antibodies).  PET scanon 01/12/2017 revealed no evidence of hypermetabolic neoplastic disease. She declined bone marrowbiopsy.  SPEPhas been followed: 0.1 on 11/18/2016, 0.2 on 12/28/2016, 0.2 on 07/20/2017, 0.1 on 07/20/2017, 0.2 on 02/14/2018, 0.2 on 08/20/2018, 0.2 on 02/18/2019, and 0.2 on 08/16/2019.  She was diagnosed with myalgic encephalomyelitis. She is followed by Dr. Nigel Bridgeman at [facility].  Symptomatically, she feels "ok".  She denies any B symptoms.  She denies any bone pain or interim infections.  IgM is 214.  Plan: 1.   Review labs from 08/16/2019. 2.   Monoclonal gammopathy of unknown significance (MGUS) Clinically, she continues to do well.   M spike remains stable at 0.2 gm/dL.             Continue surveillance every 6 months. 3.   RTC in 6 months for MD assessment and labs (CBC with diff, CMP, SPEP).  I discussed the assessment and treatment plan with the patient.  The patient was provided an opportunity to ask questions and all were answered.  The patient agreed with the plan and demonstrated an understanding of the instructions.  The patient was advised to call back if the symptoms worsen or if the condition fails to improve as anticipated.   Lequita Asal, MD, PhD    08/19/2019, 9:51 AM  I, Selena Batten, am acting as scribe for [employer]. Mike Gip, MD, PhD.  I, Rich Paprocki C. Mike Gip, MD, have reviewed the above documentation for accuracy and  completeness, and I agree with the above.

## 2019-08-16 ENCOUNTER — Other Ambulatory Visit: Payer: Self-pay

## 2019-08-16 ENCOUNTER — Inpatient Hospital Stay: Payer: BC Managed Care – PPO | Attending: Hematology and Oncology

## 2019-08-16 DIAGNOSIS — Z791 Long term (current) use of non-steroidal anti-inflammatories (NSAID): Secondary | ICD-10-CM | POA: Insufficient documentation

## 2019-08-16 DIAGNOSIS — M1612 Unilateral primary osteoarthritis, left hip: Secondary | ICD-10-CM | POA: Insufficient documentation

## 2019-08-16 DIAGNOSIS — Z803 Family history of malignant neoplasm of breast: Secondary | ICD-10-CM | POA: Diagnosis not present

## 2019-08-16 DIAGNOSIS — Z806 Family history of leukemia: Secondary | ICD-10-CM | POA: Diagnosis not present

## 2019-08-16 DIAGNOSIS — M797 Fibromyalgia: Secondary | ICD-10-CM | POA: Insufficient documentation

## 2019-08-16 DIAGNOSIS — Z7951 Long term (current) use of inhaled steroids: Secondary | ICD-10-CM | POA: Insufficient documentation

## 2019-08-16 DIAGNOSIS — Z79899 Other long term (current) drug therapy: Secondary | ICD-10-CM | POA: Diagnosis not present

## 2019-08-16 DIAGNOSIS — D472 Monoclonal gammopathy: Secondary | ICD-10-CM | POA: Insufficient documentation

## 2019-08-16 LAB — COMPREHENSIVE METABOLIC PANEL
ALT: 21 U/L (ref 0–44)
AST: 18 U/L (ref 15–41)
Albumin: 4.2 g/dL (ref 3.5–5.0)
Alkaline Phosphatase: 62 U/L (ref 38–126)
Anion gap: 11 (ref 5–15)
BUN: 15 mg/dL (ref 8–23)
CO2: 26 mmol/L (ref 22–32)
Calcium: 9.2 mg/dL (ref 8.9–10.3)
Chloride: 103 mmol/L (ref 98–111)
Creatinine, Ser: 0.6 mg/dL (ref 0.44–1.00)
GFR calc Af Amer: >60 mL/min (ref >60)
GFR calc non Af Amer: >60 mL/min (ref >60)
Glucose, Bld: 102 mg/dL — ABNORMAL HIGH (ref 70–99)
Potassium: 4.4 mmol/L (ref 3.5–5.1)
Sodium: 140 mmol/L (ref 135–145)
Total Bilirubin: 0.7 mg/dL (ref 0.3–1.2)
Total Protein: 7.2 g/dL (ref 6.5–8.1)

## 2019-08-16 LAB — CBC WITH DIFFERENTIAL/PLATELET
Abs Immature Granulocytes: 0.01 10*3/uL (ref 0.00–0.07)
Basophils Absolute: 0.1 10*3/uL (ref 0.0–0.1)
Basophils Relative: 1 %
Eosinophils Absolute: 0.2 10*3/uL (ref 0.0–0.5)
Eosinophils Relative: 3 %
HCT: 44 % (ref 36.0–46.0)
Hemoglobin: 14.2 g/dL (ref 12.0–15.0)
Immature Granulocytes: 0 %
Lymphocytes Relative: 36 %
Lymphs Abs: 2.7 10*3/uL (ref 0.7–4.0)
MCH: 30 pg (ref 26.0–34.0)
MCHC: 32.3 g/dL (ref 30.0–36.0)
MCV: 93 fL (ref 80.0–100.0)
Monocytes Absolute: 0.6 10*3/uL (ref 0.1–1.0)
Monocytes Relative: 8 %
Neutro Abs: 3.9 10*3/uL (ref 1.7–7.7)
Neutrophils Relative %: 52 %
Platelets: 250 10*3/uL (ref 150–400)
RBC: 4.73 MIL/uL (ref 3.87–5.11)
RDW: 13.7 % (ref 11.5–15.5)
WBC: 7.4 10*3/uL (ref 4.0–10.5)
nRBC: 0 % (ref 0.0–0.2)

## 2019-08-19 ENCOUNTER — Inpatient Hospital Stay: Payer: BC Managed Care – PPO

## 2019-08-19 ENCOUNTER — Encounter: Payer: Self-pay | Admitting: Hematology and Oncology

## 2019-08-19 ENCOUNTER — Inpatient Hospital Stay (HOSPITAL_BASED_OUTPATIENT_CLINIC_OR_DEPARTMENT_OTHER): Payer: BC Managed Care – PPO | Admitting: Hematology and Oncology

## 2019-08-19 DIAGNOSIS — D472 Monoclonal gammopathy: Secondary | ICD-10-CM | POA: Diagnosis not present

## 2019-08-19 LAB — MULTIPLE MYELOMA PANEL, SERUM
Albumin SerPl Elph-Mcnc: 3.7 g/dL (ref 2.9–4.4)
Albumin/Glob SerPl: 1.3 (ref 0.7–1.7)
Alpha 1: 0.2 g/dL (ref 0.0–0.4)
Alpha2 Glob SerPl Elph-Mcnc: 0.8 g/dL (ref 0.4–1.0)
B-Globulin SerPl Elph-Mcnc: 1.1 g/dL (ref 0.7–1.3)
Gamma Glob SerPl Elph-Mcnc: 0.7 g/dL (ref 0.4–1.8)
Globulin, Total: 2.9 g/dL (ref 2.2–3.9)
IgA: 63 mg/dL — ABNORMAL LOW (ref 87–352)
IgG (Immunoglobin G), Serum: 602 mg/dL (ref 586–1602)
IgM (Immunoglobulin M), Srm: 201 mg/dL (ref 26–217)
M Protein SerPl Elph-Mcnc: 0.2 g/dL — ABNORMAL HIGH
Total Protein ELP: 6.6 g/dL (ref 6.0–8.5)

## 2019-08-19 NOTE — Progress Notes (Signed)
No new changes noted today. The patient Name and DOB has been verified by phone today. 

## 2020-02-13 NOTE — Progress Notes (Signed)
[facility]  [address] [place], [place] [address] Phone: [phone]  Fax: [phone]   Clinic Day:  02/17/2020  Referring physician: Hortencia Pilar, MD  Chief Complaint: Jeanne Anderson is a 59 y.o. female with a monoclonal gammopathy of unknown significance (MGUS) and stage IA right breast cancer who is seen for 6 month assessment.  HPI: The patient was last seen in the medical oncology clinic on 08/19/2019 via telemedicine. At that time, she felt "ok".  She denied any B symptoms.  She denied any bone pain or interim infections.  IgM was 214. Hematocrit was 44.0, hemoglobin 14.2, platelets 250,000, WBC 7,400. CMP was normal. M spike was 0.2 g/dL. We discussed continued surveillance.  Diagnostic mammogram on 09/30/2019 revealed fine pleomorphic grouped calcifications in the upper outer quadrant, middle/posterior depth of the right breast spanning up to 2 cm.  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  Biopsy was ER/PR+, Her-2 -. The patient underwent right partial mastectomy by Dr. OlFreddi Chen 11/04/2019. Margins were negative (en face medial margin; 1 mm from blue inked margin,; > 2 mm from all other margins). Pathology revealed DCIS spanning 32 mm.  One sentinel lymph node was negative.  Pathologic stage was pT1m26m0.  Abdomen CT with contrast on 12/03/2019 revealed an indeterminate 1.5 cm lobulated segment 2/3 hepatic lesion, likely reflected a bilobed cyst in the setting of additional hepatic cystic lesions which measure up to 1.5 cm. However additional imaging with multiphasic MRI should be considered if these findings have not been reported or stable in the setting of breast carcinoma.  [facility].   [facility] germline testing was negative.  She discussed adjuvant RT then ET for 5-10 years.  Plan was to start letrozole and repeat bone density for a new  baseline.  Calcium, vitamin D, and weight bearing exercises were recommended.  Plan was to monitor the cystic appearing liver lesions.  She has a follow-up 6 months post radiation and after the start of AI.  Bone density on 01/14/2020 was normal with a T-score of 1.4 at the lumbar spine and 1.8 in the left proximal femur.  During the interim, she has been okay. She states that she had radiation which ended about 6 weeks ago. She has been on letrozole for 2-3 weeks. She notes a knot in her right breast.  Since starting letrozole, she has had hot flashes. She gets dizzy and nauseous for about 30 minutes after she takes it. She still sees floaters in her eyes. This improved for some time, but has gotten worse again. She is seeing her eye doctor again in 2 weeks.  She has lost 23 lbs over the past year. She has been following the "i Diet." She eats about 1,600 calories daily. Her weight has been fluctuating lately due to letrozole.  She takes Calcium and Vitamin D.   Past Medical History:  Diagnosis Date  . Anxiety   . Arthritis   . Asthma   . Dizziness   . Fibromyalgia   . History of Epstein-Barr virus infection   . Memory changes   . Muscular pain   . Numbness and tingling   . Vision changes     Past Surgical History:  Procedure Laterality Date  . ABDOMINAL HYSTERECTOMY    . COLONOSCOPY  2016  . HIP ARTHROPLASTY      Family History  Problem  Relation Age of Onset  . Cancer Mother   . Leukemia Sister   . Breast cancer Sister     Social History:  reports that she has never smoked. She has never used smokeless tobacco. She reports current alcohol use. She reports that she does not use drugs. She denies any exposure to radiation or toxins. She is a [employer]. She lives in [place]. The patient was alone today.  Allergies:  Allergies  Allergen Reactions  . Lidocaine     Other reaction(s): Unknown Weird things go numb per pt.   . Meloxicam     Other reaction(s): Other  (See Comments) Other Reaction: OTHER REACTION    Current Medications: Current Outpatient Medications  Medication Sig Dispense Refill  . albuterol (PROVENTIL) (2.5 MG/3ML) 0.083% nebulizer solution Inhale into the lungs.    . ALPRAZolam (XANAX) 0.25 MG tablet Take 0.25 mg by mouth as needed.    Marland Kitchen amphetamine-dextroamphetamine (ADDERALL) 12.5 MG tablet Take by mouth.    . budesonide (PULMICORT FLEXHALER) 180 MCG/ACT inhaler Inhale into the lungs.    . calcium carbonate (OS-CAL - DOSED IN MG OF ELEMENTAL CALCIUM) 1250 (500 Ca) MG tablet Take 1 tablet by mouth.    . Cholecalciferol (VITAMIN D3) 5000 units CAPS Take 5,000 Units by mouth daily.    Marland Kitchen gabapentin (NEURONTIN) 300 MG capsule Take 300 mg by mouth daily.   4  . letrozole (FEMARA) 2.5 MG tablet Take by mouth.    . ondansetron (ZOFRAN-ODT) 4 MG disintegrating tablet SMARTSIG:1 Tablet(s) By Mouth 4-5 Times Daily    . triamcinolone (NASACORT) 55 MCG/ACT AERO nasal inhaler Place 1 spray into the nose daily.    . vitamin B-12 (CYANOCOBALAMIN) 1000 MCG tablet Take 1,000 mcg by mouth daily.    . vitamin E 180 MG (400 UNITS) capsule Take by mouth.    Marland Kitchen albuterol (PROVENTIL) (2.5 MG/3ML) 0.083% nebulizer solution Inhale 2.5 mg into the lungs every 4 (four) hours as needed.     . Budesonide (PULMICORT FLEXHALER IN) Inhale 1 puff into the lungs daily.     Marland Kitchen ibuprofen (ADVIL,MOTRIN) 800 MG tablet Take 800 mg by mouth as needed. (Patient not taking: Reported on 02/17/2020)     No current facility-administered medications for this visit.    Review of Systems  Constitutional: Positive for weight loss (23 lbs over the past year). Negative for chills, diaphoresis, fever and malaise/fatigue.  HENT: Negative.  Negative for congestion, ear discharge, ear pain, hearing loss, nosebleeds, sinus pain, sore throat and tinnitus.   Eyes: Negative.  Negative for blurred vision, double vision, pain and redness.       Floaters in RIGHT eye; followed by  opthalmology.  Respiratory: Negative.  Negative for cough, hemoptysis, sputum production and shortness of breath.   Cardiovascular: Negative for chest pain, palpitations, orthopnea, leg swelling and PND.  Gastrointestinal: Positive for nausea (after taking letrozole). Negative for abdominal pain, blood in stool, constipation, diarrhea, heartburn, melena and vomiting.       Following the "i Diet." Eats 1,600 calories daily.  Genitourinary: Negative.  Negative for dysuria, frequency, hematuria and urgency.  Musculoskeletal: Positive for joint pain (left hip pain secondary to arthritis). Negative for back pain, falls, myalgias and neck pain.       S/p right hip surgery. Right breast knot and soreness.  Skin: Negative.  Negative for itching and rash.  Neurological: Positive for dizziness (after taking letrozole). Negative for tingling, tremors, speech change, focal weakness, weakness and headaches.  Endo/Heme/Allergies:  Does not bruise/bleed easily.       On Letrozole. Reports hot flashes.  Psychiatric/Behavioral: Negative.  Negative for depression and memory loss. The patient is not nervous/anxious and does not have insomnia.   All other systems reviewed and are negative.  Performance status (ECOG): 1  Vital signs: Blood pressure (!) 121/41, pulse 77, temperature (!) 97.2 F (36.2 C), temperature source Tympanic, resp. rate 16, weight 169 lb 3.3 oz (76.8 kg), SpO2 97 %.  Physical Exam Nursing note reviewed.  Constitutional:      General: She is not in acute distress.    Appearance: She is well-developed. She is not diaphoretic.  HENT:     Head: Normocephalic and atraumatic.     Comments: Long dark hair.    Mouth/Throat:     Mouth: Mucous membranes are moist.     Pharynx: Oropharynx is clear.  Eyes:     General: No scleral icterus.    Extraocular Movements: Extraocular movements intact.     Conjunctiva/sclera: Conjunctivae normal.     Pupils: Pupils are equal, round, and reactive to  light.     Comments: Glasses.  Brown eyes.   Cardiovascular:     Rate and Rhythm: Normal rate and regular rhythm.     Heart sounds: Normal heart sounds. No murmur heard.   Pulmonary:     Effort: Pulmonary effort is normal. No respiratory distress.     Breath sounds: Normal breath sounds. No wheezing or rales.  Chest:     Chest wall: No tenderness.     Breasts:        Right: Skin change (Fibrocystic changes at inferior aspect of right breast. Scarring between 8 and 10 o clock.) and tenderness present.      Comments: Well healed right axillary incision. Abdominal:     General: Bowel sounds are normal. There is no distension.     Palpations: Abdomen is soft. There is no mass.     Tenderness: There is no abdominal tenderness. There is no guarding or rebound.  Musculoskeletal:        General: No swelling or tenderness. Normal range of motion.     Cervical back: Normal range of motion and neck supple.  Lymphadenopathy:     Head:     Right side of head: No preauricular, posterior auricular or occipital adenopathy.     Left side of head: No preauricular, posterior auricular or occipital adenopathy.     Cervical: No cervical adenopathy.     Upper Body:     Right upper body: No supraclavicular or axillary adenopathy.     Left upper body: No supraclavicular or axillary adenopathy.     Lower Body: No right inguinal adenopathy. No left inguinal adenopathy.  Skin:    General: Skin is warm and dry.  Neurological:     Mental Status: She is alert and oriented to person, place, and time.  Psychiatric:        Behavior: Behavior normal.        Thought Content: Thought content normal.        Judgment: Judgment normal.    Appointment on 02/17/2020  Component Date Value Ref Range Status  . WBC 02/17/2020 6.2  4.0 - 10.5 K/uL Final  . RBC 02/17/2020 4.57  3.87 - 5.11 MIL/uL Final  . Hemoglobin 02/17/2020 14.2  12.0 - 15.0 g/dL Final  . HCT 82/41/7530 43.2  36 - 46 % Final  . MCV 02/17/2020  94.5  80.0 - 100.0 fL  Final  . MCH 02/17/2020 31.1  26.0 - 34.0 pg Final  . MCHC 02/17/2020 32.9  30.0 - 36.0 g/dL Final  . RDW 56/27/7367 13.8  11.5 - 15.5 % Final  . Platelets 02/17/2020 245  150 - 400 K/uL Final  . nRBC 02/17/2020 0.0  0.0 - 0.2 % Final  . Neutrophils Relative % 02/17/2020 62  % Final  . Neutro Abs 02/17/2020 3.9  1.7 - 7.7 K/uL Final  . Lymphocytes Relative 02/17/2020 26  % Final  . Lymphs Abs 02/17/2020 1.6  0.7 - 4.0 K/uL Final  . Monocytes Relative 02/17/2020 8  % Final  . Monocytes Absolute 02/17/2020 0.5  0 - 1 K/uL Final  . Eosinophils Relative 02/17/2020 3  % Final  . Eosinophils Absolute 02/17/2020 0.2  0 - 0 K/uL Final  . Basophils Relative 02/17/2020 1  % Final  . Basophils Absolute 02/17/2020 0.0  0 - 0 K/uL Final  . Immature Granulocytes 02/17/2020 0  % Final  . Abs Immature Granulocytes 02/17/2020 0.02  0.00 - 0.07 K/uL Final   Performed at [facility], [address], [place], [place] [address]  . Sodium 02/17/2020 138  135 - 145 mmol/L Final  . Potassium 02/17/2020 3.8  3.5 - 5.1 mmol/L Final  . Chloride 02/17/2020 103  98 - 111 mmol/L Final  . CO2 02/17/2020 29  22 - 32 mmol/L Final  . Glucose, Bld 02/17/2020 88  70 - 99 mg/dL Final   Glucose reference range applies only to samples taken after fasting for at least 8 hours.  . BUN 02/17/2020 16  8 - 23 mg/dL Final  . Creatinine, Ser 02/17/2020 0.63  0.44 - 1.00 mg/dL Final  . Calcium 88/75/5676 8.7* 8.9 - 10.3 mg/dL Final  . Total Protein 02/17/2020 6.9  6.5 - 8.1 g/dL Final  . Albumin 15/91/8474 4.0  3.5 - 5.0 g/dL Final  . AST 99/10/1363 17  15 - 41 U/L Final  . ALT 02/17/2020 20  0 - 44 U/L Final  . Alkaline Phosphatase 02/17/2020 55  38 - 126 U/L Final  . Total Bilirubin 02/17/2020 0.7  0.3 - 1.2 mg/dL Final  . GFR calc non Af Amer 02/17/2020 >60  >60 mL/min Final  . GFR calc Af Amer 02/17/2020 >60  >60 mL/min Final  . Anion gap 02/17/2020 6  5 - 15 Final   Performed at [facility], [address] [place], [place] [address]    Assessment:  Jeanne Anderson is a 59 y.o. female with a monoclonal gammopathy of unknown significance(MGUS). She was noted to have a small monoclonal proteinin 11/2016. She has a constellation of neurologic symptomspossibly related to myalgic encephalomyelitis. She was diagnosed with myalgic encephalomyelitis.  Head MRI on 12/06/2016 revealed no specific findings of multiple sclerosis. There were not multiple small nonspecific scattered foci of increased T2 weighted signal within the subcortical and deep white matter likely indicating chronic microvascular ischemic changes or sequela due to migraine headaches.  CBCon 11/18/2016 revealed a hematocrit of 44.6, hemoglobin 15, MCV 91, platelets 283,000, white count 9200, and a normal differential. SPEP revealed a 0.1 gm/dL IgM lambda monoclonal protein. Normal studies included: B12, TSH. Hepatitis C testing and HIV testing were negative on 08/29/2016. ANA was negative on 02/10/2015. Rheumatoid factor was negative on 12/13/2006.   Work-up on 06/20/2018revealed a hematocrit of 44.3, hemoglobin 14.9, MCV 90.3, platelets 254,000, white count 9500 with an ANC of 5600. SPEPrevealed a 0.2 g/dL  IgM monoclonal protein with lambda light chain specificity. IgG was 677 (low), IgA 72 (low), and IgM 228 (26-217). Free light chain ratio was normal. 24-hour urine protein was less than 4.0 mg/dL. There was no monoclonal protein. Immunofixation was normal. Normal studies on 12/28/2016: TSH, serum viscosity, LDH, and beta-2 microglobulin. Paraneoplastic panelwas negative (anti-Hu antibodies, anti-Ri antibodies).  PET scanon 01/12/2017 revealed no evidence of hypermetabolic neoplastic disease. She declined bone marrowbiopsy.  SPEPhas been followed: 0.1 on 11/18/2016, 0.2 on 12/28/2016, 0.2 on 07/20/2017, 0.1 on 07/20/2017, 0.2 on 02/14/2018, 0.2 on 08/20/2018, 0.2 on  02/18/2019, 0.2 on 08/16/2019, and 0 on 02/17/2020.  She was diagnosed with stage IA right breast cancer s/p right partial mastectomy on 11/04/2019.  Diagnostic mammogram on 09/30/2019 revealed fine pleomorphic grouped calcifications in the upper outer quadrant, middle/posterior depth of the right breast.  Biopsy on 10/11/2019 revealed grade II DCIS with < 8m microscopic focus of IDC (pT194mpN0).  Biopsy was ER/PR+, Her-2 -.  Final pathology revealed negative margins(en face medial margin; 1 mm from blue inked margin,; > 2 mm from all other margins).  DCIS spanned 32 mm.  One sentinel lymph node was negative.  Pathologic stage was pT1m28m0.  Abdomen CT with contrast on 12/03/2019 revealed an indeterminate 1.5 cm lobulated segment 2/3 hepatic lesion, likely reflected a bilobed cyst in the setting of additional hepatic cystic lesions which measure up to 1.5 cm.  Invitae germline testing was negative.    Breast radiation completed 6 weeks ago.  She is on letrozole.    She was diagnosed with myalgic encephalomyelitis. She is followed [facility]2020 was normal with a T-score of 1.4 at the lumbar spine and 1.8 in the left proximal femur.  The patient received the COVID-19 vaccine in 10/2019.  Symptomatically, she has had hot flashes. She gets dizzy and nauseous for about 30 minutes after she taking letrozole.  She has lost 23 lbs over the past year. She has been following the "i Diet".  Exam reveals no evidence of current disease.  Plan: 1.   Labs today: CBC with diff, CMP, SPEP 2.   Monoclonal gammopathy of unknown significance (MGUS) Clinically, she is doing well.   M spike is 0 today (improved).  Continue to monitor. 3.   Stage IA right breast cancer  Review interval diagnosis and management of stage I breast cancer.  She had a microscopic focus of invasive ductal carcinoma with DCIS spanning 3.2 cm.  Margins were negative.  Sentinel lymph node was  negative.  Pathologic stage was pT1mi64m.  Radiation completed 6 weeks ago.  She is on letrozole. 4.   RN:  check orthostatics and call PCP. 5.   RTC in 6 months for MD assessment and labs (CBC with diff, CMP, SPEP, CA27.29).  I discussed the assessment and treatment plan with the patient.  The patient was provided an opportunity to ask questions and all were answered.  The patient agreed with the plan and demonstrated an understanding of the instructions.  The patient was advised to call back if the symptoms worsen or if the condition fails to improve as anticipated.  I provided 22 minutes of face-to-face time during this this encounter and > 50% was spent counseling as documented under my assessment and plan.  An additional 15+ minutes were spent reviewing his chart (Epic and Care Everywhere) including notes, labs, and imaging studies.   MeliLequita Asal, PhD    02/17/2020, 10:43  AM  I, Mirian Mo Tufford, am acting as Education administrator for [employer]. Mike Gip, MD, PhD.  I, Kyla Duffy C. Mike Gip, MD, have reviewed the above documentation for accuracy and completeness, and I agree with the above.

## 2020-02-17 ENCOUNTER — Ambulatory Visit: Payer: BC Managed Care – PPO | Admitting: Hematology and Oncology

## 2020-02-17 ENCOUNTER — Other Ambulatory Visit: Payer: Self-pay

## 2020-02-17 ENCOUNTER — Encounter: Payer: Self-pay | Admitting: Hematology and Oncology

## 2020-02-17 ENCOUNTER — Inpatient Hospital Stay (HOSPITAL_BASED_OUTPATIENT_CLINIC_OR_DEPARTMENT_OTHER): Payer: BC Managed Care – PPO | Admitting: Hematology and Oncology

## 2020-02-17 ENCOUNTER — Other Ambulatory Visit: Payer: BC Managed Care – PPO

## 2020-02-17 ENCOUNTER — Inpatient Hospital Stay: Payer: BC Managed Care – PPO | Attending: Hematology and Oncology

## 2020-02-17 ENCOUNTER — Telehealth: Payer: Self-pay

## 2020-02-17 VITALS — BP 121/41 | HR 77 | Temp 97.2°F | Resp 16 | Wt 169.2 lb

## 2020-02-17 DIAGNOSIS — Z791 Long term (current) use of non-steroidal anti-inflammatories (NSAID): Secondary | ICD-10-CM | POA: Insufficient documentation

## 2020-02-17 DIAGNOSIS — Z806 Family history of leukemia: Secondary | ICD-10-CM | POA: Diagnosis not present

## 2020-02-17 DIAGNOSIS — Z79811 Long term (current) use of aromatase inhibitors: Secondary | ICD-10-CM | POA: Diagnosis not present

## 2020-02-17 DIAGNOSIS — C50411 Malignant neoplasm of upper-outer quadrant of right female breast: Secondary | ICD-10-CM

## 2020-02-17 DIAGNOSIS — D472 Monoclonal gammopathy: Secondary | ICD-10-CM | POA: Insufficient documentation

## 2020-02-17 DIAGNOSIS — Z7951 Long term (current) use of inhaled steroids: Secondary | ICD-10-CM | POA: Insufficient documentation

## 2020-02-17 DIAGNOSIS — R42 Dizziness and giddiness: Secondary | ICD-10-CM | POA: Diagnosis not present

## 2020-02-17 DIAGNOSIS — Z7189 Other specified counseling: Secondary | ICD-10-CM | POA: Diagnosis not present

## 2020-02-17 DIAGNOSIS — Z79899 Other long term (current) drug therapy: Secondary | ICD-10-CM | POA: Insufficient documentation

## 2020-02-17 DIAGNOSIS — Z803 Family history of malignant neoplasm of breast: Secondary | ICD-10-CM | POA: Insufficient documentation

## 2020-02-17 DIAGNOSIS — Z170 Estrogen receptor positive status [ER+]: Secondary | ICD-10-CM | POA: Insufficient documentation

## 2020-02-17 DIAGNOSIS — H43391 Other vitreous opacities, right eye: Secondary | ICD-10-CM | POA: Insufficient documentation

## 2020-02-17 DIAGNOSIS — R110 Nausea: Secondary | ICD-10-CM | POA: Diagnosis not present

## 2020-02-17 DIAGNOSIS — M797 Fibromyalgia: Secondary | ICD-10-CM | POA: Diagnosis not present

## 2020-02-17 DIAGNOSIS — M1990 Unspecified osteoarthritis, unspecified site: Secondary | ICD-10-CM | POA: Insufficient documentation

## 2020-02-17 DIAGNOSIS — C50911 Malignant neoplasm of unspecified site of right female breast: Secondary | ICD-10-CM | POA: Insufficient documentation

## 2020-02-17 LAB — CBC WITH DIFFERENTIAL/PLATELET
Abs Immature Granulocytes: 0.02 10*3/uL (ref 0.00–0.07)
Basophils Absolute: 0 10*3/uL (ref 0.0–0.1)
Basophils Relative: 1 %
Eosinophils Absolute: 0.2 10*3/uL (ref 0.0–0.5)
Eosinophils Relative: 3 %
HCT: 43.2 % (ref 36.0–46.0)
Hemoglobin: 14.2 g/dL (ref 12.0–15.0)
Immature Granulocytes: 0 %
Lymphocytes Relative: 26 %
Lymphs Abs: 1.6 10*3/uL (ref 0.7–4.0)
MCH: 31.1 pg (ref 26.0–34.0)
MCHC: 32.9 g/dL (ref 30.0–36.0)
MCV: 94.5 fL (ref 80.0–100.0)
Monocytes Absolute: 0.5 10*3/uL (ref 0.1–1.0)
Monocytes Relative: 8 %
Neutro Abs: 3.9 10*3/uL (ref 1.7–7.7)
Neutrophils Relative %: 62 %
Platelets: 245 10*3/uL (ref 150–400)
RBC: 4.57 MIL/uL (ref 3.87–5.11)
RDW: 13.8 % (ref 11.5–15.5)
WBC: 6.2 10*3/uL (ref 4.0–10.5)
nRBC: 0 % (ref 0.0–0.2)

## 2020-02-17 LAB — COMPREHENSIVE METABOLIC PANEL
ALT: 20 U/L (ref 0–44)
AST: 17 U/L (ref 15–41)
Albumin: 4 g/dL (ref 3.5–5.0)
Alkaline Phosphatase: 55 U/L (ref 38–126)
Anion gap: 6 (ref 5–15)
BUN: 16 mg/dL (ref 8–23)
CO2: 29 mmol/L (ref 22–32)
Calcium: 8.7 mg/dL — ABNORMAL LOW (ref 8.9–10.3)
Chloride: 103 mmol/L (ref 98–111)
Creatinine, Ser: 0.63 mg/dL (ref 0.44–1.00)
GFR calc Af Amer: >60 mL/min (ref >60)
GFR calc non Af Amer: >60 mL/min (ref >60)
Glucose, Bld: 88 mg/dL (ref 70–99)
Potassium: 3.8 mmol/L (ref 3.5–5.1)
Sodium: 138 mmol/L (ref 135–145)
Total Bilirubin: 0.7 mg/dL (ref 0.3–1.2)
Total Protein: 6.9 g/dL (ref 6.5–8.1)

## 2020-02-17 NOTE — Patient Instructions (Signed)
  Take calcium 1200 mg a day with vitamin D 800 units a day.

## 2020-02-17 NOTE — Progress Notes (Signed)
Patient here for oncology follow-up appointment, expresses complaints of right eye pain with headaches and vertigo, and extreme nausea.

## 2020-02-18 LAB — PROTEIN ELECTROPHORESIS, SERUM
A/G Ratio: 1.3 (ref 0.7–1.7)
Albumin ELP: 3.6 g/dL (ref 2.9–4.4)
Alpha-1-Globulin: 0.2 g/dL (ref 0.0–0.4)
Alpha-2-Globulin: 0.8 g/dL (ref 0.4–1.0)
Beta Globulin: 1.1 g/dL (ref 0.7–1.3)
Gamma Globulin: 0.7 g/dL (ref 0.4–1.8)
Globulin, Total: 2.8 g/dL (ref 2.2–3.9)
Total Protein ELP: 6.4 g/dL (ref 6.0–8.5)

## 2020-06-19 DIAGNOSIS — C50411 Malignant neoplasm of upper-outer quadrant of right female breast: Secondary | ICD-10-CM | POA: Diagnosis not present

## 2020-06-19 DIAGNOSIS — Z170 Estrogen receptor positive status [ER+]: Secondary | ICD-10-CM | POA: Diagnosis not present

## 2020-06-19 DIAGNOSIS — Z6830 Body mass index (BMI) 30.0-30.9, adult: Secondary | ICD-10-CM | POA: Diagnosis not present

## 2020-06-23 DIAGNOSIS — R920 Mammographic microcalcification found on diagnostic imaging of breast: Secondary | ICD-10-CM | POA: Diagnosis not present

## 2020-06-23 DIAGNOSIS — C50911 Malignant neoplasm of unspecified site of right female breast: Secondary | ICD-10-CM | POA: Diagnosis not present

## 2020-06-23 DIAGNOSIS — G9689 Other specified disorders of central nervous system: Secondary | ICD-10-CM | POA: Diagnosis not present

## 2020-06-23 DIAGNOSIS — D0511 Intraductal carcinoma in situ of right breast: Secondary | ICD-10-CM | POA: Diagnosis not present

## 2020-06-23 DIAGNOSIS — Z6831 Body mass index (BMI) 31.0-31.9, adult: Secondary | ICD-10-CM | POA: Diagnosis not present

## 2020-06-23 DIAGNOSIS — M797 Fibromyalgia: Secondary | ICD-10-CM | POA: Diagnosis not present

## 2020-06-23 DIAGNOSIS — C50811 Malignant neoplasm of overlapping sites of right female breast: Secondary | ICD-10-CM | POA: Diagnosis not present

## 2020-06-23 DIAGNOSIS — Z90721 Acquired absence of ovaries, unilateral: Secondary | ICD-10-CM | POA: Diagnosis not present

## 2020-06-23 DIAGNOSIS — Z170 Estrogen receptor positive status [ER+]: Secondary | ICD-10-CM | POA: Diagnosis not present

## 2020-06-23 DIAGNOSIS — Z90710 Acquired absence of both cervix and uterus: Secondary | ICD-10-CM | POA: Diagnosis not present

## 2020-06-23 DIAGNOSIS — Z79899 Other long term (current) drug therapy: Secondary | ICD-10-CM | POA: Diagnosis not present

## 2020-06-23 DIAGNOSIS — Z803 Family history of malignant neoplasm of breast: Secondary | ICD-10-CM | POA: Diagnosis not present

## 2020-07-09 DIAGNOSIS — H2512 Age-related nuclear cataract, left eye: Secondary | ICD-10-CM | POA: Diagnosis not present

## 2020-07-15 DIAGNOSIS — F09 Unspecified mental disorder due to known physiological condition: Secondary | ICD-10-CM | POA: Diagnosis not present

## 2020-07-15 DIAGNOSIS — G933 Postviral fatigue syndrome: Secondary | ICD-10-CM | POA: Diagnosis not present

## 2020-07-15 DIAGNOSIS — G909 Disorder of the autonomic nervous system, unspecified: Secondary | ICD-10-CM | POA: Diagnosis not present

## 2020-07-15 DIAGNOSIS — R42 Dizziness and giddiness: Secondary | ICD-10-CM | POA: Diagnosis not present

## 2020-08-17 ENCOUNTER — Other Ambulatory Visit: Payer: Self-pay

## 2020-08-17 NOTE — Progress Notes (Signed)
[facility]  [address], [address] [place], [place] [address] Phone: [phone]  Fax: [phone]   Clinic Day:  08/19/2020  Referring physician: Hortencia Pilar, MD  Chief Complaint: Jeanne Anderson is a 60 y.o. female with a monoclonal gammopathy of unknown significance (MGUS) and stage IA right breast cancer who is seen for 6 month assessment.  HPI: The patient was last seen in the medical oncology clinic on 02/17/2020. At that time, she had hot flashes. She described becoming dizzy and nauseous for about 30 minutes after she taking letrozole.  She had lost 23 lbs over the past year. She had been following the "i Diet".  Exam revealed no evidence of current disease. Hematocrit was 43.2, hemoglobin 14.2, platelets 245,000, WBC 6,200. Calcium was 8.7. SPEP was normal.  Right diagnostic mammogram at [facility] on 04/17/2020 revealed BI-RADS Category: [id number]: Benign. Mammography in 6 months was recommended.   Abdomen CT with contrast on 04/21/2020 revealed no evidence of metastatic disease in the abdomen or pelvis.  The patient [facility] Dr[facility] DEleonore Chiquito[facility] on 06/19/2020.  Dizziness was not felt due to letrozole as she discontinued letrozole for 3 weeks without an symptom improvement.  Dizziness had improved since her last visit.  She was to continue letrozole, calcium and vitamin D and weightbearing exercise.  She is to continue mammograms every 6 months.  She has a follow-up in 3 to 4 months.  The patient saw Dr. HNigel Bridgeman([facility]) on 07/15/2020 for follow-up of myalgic encephalomyelitis.  Her cognitive fog seem to have worsened over the last several months possibly felt related to triggers from several life stressors (cancer, passing of family member and birth of another and loss of one of her favorite eTherapist, music.  An empiric trial of Florinef 0.1 mg was prescribed for dizziness autonomic dysfunction.  She was to continue Adderall for cognitive  dysfunction.  During the interim, she has been "ok". She has myalgic encephalomyelitis and post-exertional malaise which have been bothering her lately. She has started taking Femara again. She has not taken any Florinef yet because she is considering having surgeries on her eyes. She is under a lot of stress right now due to her vision and a death in the family.  Her dizziness was doing better, but has come back and is now accompanied by nausea. Her left hip pain has been bothering her this week. She denies any infections.  She has a relative who was diagnosed with paraneoplastic cerebellar degeneration.   Past Medical History:  Diagnosis Date  . Anxiety   . Arthritis   . Asthma   . Dizziness   . Fibromyalgia   . History of Epstein-Barr virus infection   . Memory changes   . Muscular pain   . Numbness and tingling   . Vision changes     Past Surgical History:  Procedure Laterality Date  . ABDOMINAL HYSTERECTOMY    . COLONOSCOPY  2016  . HIP ARTHROPLASTY      Family History  Problem Relation Age of Onset  . Cancer Mother   . Leukemia Sister   . Breast cancer Sister     Social History:  reports that she has never smoked. She has never used smokeless tobacco. She reports current alcohol use. She reports that she does not use drugs. She denies any exposure to radiation or toxins. 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  Allergies:  Allergies  Allergen Reactions  .  Lidocaine     Other reaction(s): Unknown Weird things go numb per pt.   . Meloxicam     Other reaction(s): Other (See Comments) Other Reaction: OTHER REACTION    Current Medications: Current Outpatient Medications  Medication Sig Dispense Refill  . albuterol (PROVENTIL) (2.5 MG/3ML) 0.083% nebulizer solution Inhale into the lungs.    . ALPRAZolam (XANAX) 0.25 MG tablet Take 0.25 mg by mouth as needed.    Marland Kitchen amphetamine-dextroamphetamine (ADDERALL) 12.5 MG tablet Take by  mouth.    . budesonide (PULMICORT FLEXHALER) 180 MCG/ACT inhaler Inhale into the lungs.    . calcium carbonate (OS-CAL - DOSED IN MG OF ELEMENTAL CALCIUM) 1250 (500 Ca) MG tablet Take 1 tablet by mouth.    . Cholecalciferol (VITAMIN D3) 5000 units CAPS Take 5,000 Units by mouth daily.    Marland Kitchen gabapentin (NEURONTIN) 300 MG capsule Take 300 mg by mouth daily.   4  . letrozole (FEMARA) 2.5 MG tablet Take by mouth daily.    . ondansetron (ZOFRAN-ODT) 4 MG disintegrating tablet SMARTSIG:1 Tablet(s) By Mouth 4-5 Times Daily    . triamcinolone (NASACORT) 55 MCG/ACT AERO nasal inhaler Place 1 spray into the nose daily.    . vitamin B-12 (CYANOCOBALAMIN) 1000 MCG tablet Take 1,000 mcg by mouth daily.    . vitamin E 180 MG (400 UNITS) capsule Take by mouth daily.    Marland Kitchen ibuprofen (ADVIL,MOTRIN) 800 MG tablet Take 800 mg by mouth as needed. (Patient not taking: No sig reported)     No current facility-administered medications for this visit.    Review of Systems  Constitutional: Positive for malaise/fatigue (post-exertoinal malaise). Negative for chills, diaphoresis, fever and weight loss (up 17 lbs).  HENT: Negative.  Negative for congestion, ear discharge, ear pain, hearing loss, nosebleeds, sinus pain, sore throat and tinnitus.   Eyes: Negative.  Negative for blurred vision.  Respiratory: Negative.  Negative for cough, hemoptysis, sputum production and shortness of breath.   Cardiovascular: Negative.  Negative for chest pain, palpitations and leg swelling.  Gastrointestinal: Positive for nausea (comes ago). Negative for abdominal pain, blood in stool, constipation, diarrhea, heartburn, melena and vomiting.  Genitourinary: Negative.  Negative for dysuria, frequency, hematuria and urgency.  Musculoskeletal: Positive for joint pain (left hip). Negative for back pain, myalgias and neck pain.  Skin: Negative.  Negative for itching and rash.  Neurological: Positive for dizziness (comes and goes). Negative for  tingling, sensory change, weakness and headaches.       Myalgic encephalomyelitis.   Endo/Heme/Allergies: Negative.  Does not bruise/bleed easily.  Psychiatric/Behavioral: Negative.  Negative for depression and memory loss. The patient is not nervous/anxious and does not have insomnia.   All other systems reviewed and are negative.  Performance status (ECOG): 1  Vital signs: Blood pressure 134/84, pulse 89, temperature 97.8 F (36.6 C), temperature source Tympanic, resp. rate 16, weight 186 lb 15.2 oz (84.8 kg), SpO2 98 %.  Physical Exam Vitals and nursing note reviewed.  Constitutional:      General: She is not in acute distress.    Appearance: She is well-developed. She is not diaphoretic.  HENT:     Head: Normocephalic and atraumatic.     Comments: Long dark hair.    Mouth/Throat:     Mouth: Mucous membranes are moist.     Pharynx: Oropharynx is clear.  Eyes:     General: No scleral icterus.    Extraocular Movements: Extraocular movements intact.     Conjunctiva/sclera: Conjunctivae normal.  Pupils: Pupils are equal, round, and reactive to light.     Comments: Glasses.  Brown eyes.   Cardiovascular:     Rate and Rhythm: Normal rate and regular rhythm.     Heart sounds: Normal heart sounds. No murmur heard.   Pulmonary:     Effort: Pulmonary effort is normal. No respiratory distress.     Breath sounds: Normal breath sounds. No wheezing or rales.  Chest:     Chest wall: No tenderness.  Breasts:     Right: No axillary adenopathy or supraclavicular adenopathy.     Left: No axillary adenopathy or supraclavicular adenopathy.    Abdominal:     General: Bowel sounds are normal. There is no distension.     Palpations: Abdomen is soft. There is no mass.     Tenderness: There is no abdominal tenderness. There is no guarding or rebound.  Musculoskeletal:        General: No swelling or tenderness. Normal range of motion.     Cervical back: Normal range of motion and neck  supple.  Lymphadenopathy:     Head:     Right side of head: No preauricular, posterior auricular or occipital adenopathy.     Left side of head: No preauricular, posterior auricular or occipital adenopathy.     Cervical: No cervical adenopathy.     Upper Body:     Right upper body: No supraclavicular or axillary adenopathy.     Left upper body: No supraclavicular or axillary adenopathy.     Lower Body: No right inguinal adenopathy. No left inguinal adenopathy.  Skin:    General: Skin is warm and dry.  Neurological:     Mental Status: She is alert and oriented to person, place, and time.  Psychiatric:        Behavior: Behavior normal.        Thought Content: Thought content normal.        Judgment: Judgment normal.    Appointment on 08/19/2020  Component Date Value Ref Range Status  . WBC 08/19/2020 6.3  4.0 - 10.5 K/uL Final  . RBC 08/19/2020 4.57  3.87 - 5.11 MIL/uL Final  . Hemoglobin 08/19/2020 13.9  12.0 - 15.0 g/dL Final  . HCT 08/19/2020 42.1  36.0 - 46.0 % Final  . MCV 08/19/2020 92.1  80.0 - 100.0 fL Final  . MCH 08/19/2020 30.4  26.0 - 34.0 pg Final  . MCHC 08/19/2020 33.0  30.0 - 36.0 g/dL Final  . RDW 08/19/2020 13.4  11.5 - 15.5 % Final  . Platelets 08/19/2020 217  150 - 400 K/uL Final  . nRBC 08/19/2020 0.0  0.0 - 0.2 % Final  . Neutrophils Relative % 08/19/2020 62  % Final  . Neutro Abs 08/19/2020 4.0  1.7 - 7.7 K/uL Final  . Lymphocytes Relative 08/19/2020 26  % Final  . Lymphs Abs 08/19/2020 1.6  0.7 - 4.0 K/uL Final  . Monocytes Relative 08/19/2020 7  % Final  . Monocytes Absolute 08/19/2020 0.4  0.1 - 1.0 K/uL Final  . Eosinophils Relative 08/19/2020 4  % Final  . Eosinophils Absolute 08/19/2020 0.2  0.0 - 0.5 K/uL Final  . Basophils Relative 08/19/2020 1  % Final  . Basophils Absolute 08/19/2020 0.1  0.0 - 0.1 K/uL Final  . Immature Granulocytes 08/19/2020 0  % Final  . Abs Immature Granulocytes 08/19/2020 0.02  0.00 - 0.07 K/uL Final   Performed at  [facility], [address]  [place], [place] [address]  . Sodium 08/19/2020 137  135 - 145 mmol/L Final  . Potassium 08/19/2020 3.8  3.5 - 5.1 mmol/L Final  . Chloride 08/19/2020 100  98 - 111 mmol/L Final  . CO2 08/19/2020 27  22 - 32 mmol/L Final  . Glucose, Bld 08/19/2020 138* 70 - 99 mg/dL Final   Glucose reference range applies only to samples taken after fasting for at least 8 hours.  . BUN 08/19/2020 17  8 - 23 mg/dL Final  . Creatinine, Ser 08/19/2020 0.65  0.44 - 1.00 mg/dL Final  . Calcium 08/19/2020 8.8* 8.9 - 10.3 mg/dL Final  . Total Protein 08/19/2020 6.8  6.5 - 8.1 g/dL Final  . Albumin 08/19/2020 3.9  3.5 - 5.0 g/dL Final  . AST 08/19/2020 19  15 - 41 U/L Final  . ALT 08/19/2020 19  0 - 44 U/L Final  . Alkaline Phosphatase 08/19/2020 56  38 - 126 U/L Final  . Total Bilirubin 08/19/2020 0.4  0.3 - 1.2 mg/dL Final  . GFR, Estimated 08/19/2020 >60  >60 mL/min Final   Comment: (NOTE) Calculated using the CKD-EPI Creatinine Equation (2021)   . Anion gap 08/19/2020 10  5 - 15 Final   Performed at [facility], [address] [place], [place] [address]    Assessment:  Jeanne Anderson is a 60 y.o. female with a monoclonal gammopathy of unknown significance(MGUS). She was noted to have a small monoclonal proteinin 11/2016. She has a constellation of neurologic symptomspossibly related to myalgic encephalomyelitis. She was diagnosed with myalgic encephalomyelitis.  Head MRI on 12/06/2016 revealed no specific findings of multiple sclerosis. There were not multiple small nonspecific scattered foci of increased T2 weighted signal within the subcortical and deep white matter likely indicating chronic microvascular ischemic changes or sequela due to migraine headaches.  CBCon 11/18/2016 revealed a hematocrit of 44.6, hemoglobin 15, MCV 91, platelets 283,000, white count 9200, and a normal differential. SPEP revealed a 0.1 gm/dL IgM lambda  monoclonal protein. Normal studies included: B12, TSH. Hepatitis C testing and HIV testing were negative on 08/29/2016. ANA was negative on 02/10/2015. Rheumatoid factor was negative on 12/13/2006.   Work-up on 06/20/2018revealed a hematocrit of 44.3, hemoglobin 14.9, MCV 90.3, platelets 254,000, white count 9500 with an ANC of 5600. SPEPrevealed a 0.2 g/dL IgM monoclonal protein with lambda light chain specificity. IgG was 677 (low), IgA 72 (low), and IgM 228 (26-217). Free light chain ratio was normal. 24-hour urine protein was less than 4.0 mg/dL. There was no monoclonal protein. Immunofixation was normal. Normal studies on 12/28/2016: TSH, serum viscosity, LDH, and beta-2 microglobulin. Paraneoplastic panelwas negative (anti-Hu antibodies, anti-Ri antibodies).  PET scanon 01/12/2017 revealed no evidence of hypermetabolic neoplastic disease. She declined bone marrowbiopsy.  SPEPhas been followed: 0.1 on 11/18/2016, 0.2 on 12/28/2016, 0.2 on 07/20/2017, 0.1 on 07/20/2017, 0.2 on 02/14/2018, 0.2 on 08/20/2018, 0.2 on 02/18/2019, 0.2 on 08/16/2019, and 0 on 02/17/2020.  She was diagnosed with stage IA right breast cancer s/p right partial mastectomy on 11/04/2019.  Diagnostic mammogram on 09/30/2019 revealed fine pleomorphic grouped calcifications in the upper outer quadrant, middle/posterior depth of the right breast.  Biopsy on 10/11/2019 revealed grade II DCIS with < 64m microscopic focus of IDC ([id number]).  Biopsy was ER/PR+, Her-2 -.  Final pathology revealed negative margins(en face medial margin; 1 mm from blue inked margin,; > 2 mm from all other margins).  DCIS spanned 32 mm.  One sentinel lymph node was negative.  Pathologic stage was [id number].  Abdomen CT with contrast on 12/03/2019 revealed an indeterminate 1.5 cm lobulated segment 2/3 hepatic lesion, likely reflected a bilobed cyst in the setting of additional hepatic cystic lesions which measure up to 1.5 cm.  Invitae  germline testing was negative.    Breast radiation completed 6 weeks ago.  She is on letrozole.    She was diagnosed with myalgic encephalomyelitis. [facility]  Bone density on 01/14/2020 was normal with a T-score of 1.4 at the lumbar spine and 1.8 in the left proximal femur.  The patient received the COVID-19 vaccine in 10/2019. She received the Booster.  Symptomatically, she has felt "ok". She has post-exertional malaise.  She has started taking Femara again.  She is under a lot of stress right now due to her vision and a death in the family.  Dizziness had improved, but has come back and is now accompanied by nausea. Her left hip pain has been bothering her this week. She denies any infections.  Exam is stable.  Plan: 1.   Labs today: CBC with diff, CMP, SPEP, CA27.29. 2.   Monoclonal gammopathy of unknown significance (MGUS) Clinically, she is doing well.  M spike is 0.2 today (slight fluctuation).  Continue to monitor. 3.   Stage IA right breast cancer  She had a microscopic focus of invasive ductal carcinoma with DCIS spanning 3.2 cm.  Margins were negative.  Sentinel lymph node was negative.  She completed radiation.  She remains on letrozole. 4.   RTC in 6 months for MD assess and labs (CBC with diff, CMP, SPEP, CA27.29).  I discussed the assessment and treatment plan with the patient.  The patient was provided an opportunity to ask questions and all were answered.  The patient agreed with the plan and demonstrated an understanding of the instructions.  The patient was advised to call back if the symptoms worsen or if the condition fails to improve as anticipated.  I provided 18 minutes of face-to-face time during this this encounter and > 50% was spent counseling as documented under my assessment and plan.  An additional 5 minutes were spent reviewing her chart (Epic and Care Everywhere) including notes, labs, and imaging studies.    MLequita Asal MD, PhD    08/19/2020, 10:42 AM  I, EMirian MoTufford, am acting as sEducation administratorfor [employer] CMike Gip MD, PhD.  I, Doaa Kendzierski C. CMike Gip MD, have reviewed the above documentation for accuracy and completeness, and I agree with the above.

## 2020-08-19 ENCOUNTER — Encounter: Payer: Self-pay | Admitting: Hematology and Oncology

## 2020-08-19 ENCOUNTER — Inpatient Hospital Stay: Payer: BC Managed Care – PPO | Attending: Hematology and Oncology

## 2020-08-19 ENCOUNTER — Other Ambulatory Visit: Payer: Self-pay

## 2020-08-19 ENCOUNTER — Inpatient Hospital Stay (HOSPITAL_BASED_OUTPATIENT_CLINIC_OR_DEPARTMENT_OTHER): Payer: BC Managed Care – PPO | Admitting: Hematology and Oncology

## 2020-08-19 VITALS — BP 134/84 | HR 89 | Temp 97.8°F | Resp 16 | Wt 186.9 lb

## 2020-08-19 DIAGNOSIS — F419 Anxiety disorder, unspecified: Secondary | ICD-10-CM | POA: Insufficient documentation

## 2020-08-19 DIAGNOSIS — Z79899 Other long term (current) drug therapy: Secondary | ICD-10-CM | POA: Insufficient documentation

## 2020-08-19 DIAGNOSIS — Z923 Personal history of irradiation: Secondary | ICD-10-CM | POA: Diagnosis not present

## 2020-08-19 DIAGNOSIS — Z79811 Long term (current) use of aromatase inhibitors: Secondary | ICD-10-CM | POA: Diagnosis not present

## 2020-08-19 DIAGNOSIS — G933 Postviral fatigue syndrome: Secondary | ICD-10-CM | POA: Diagnosis not present

## 2020-08-19 DIAGNOSIS — D472 Monoclonal gammopathy: Secondary | ICD-10-CM

## 2020-08-19 DIAGNOSIS — J45909 Unspecified asthma, uncomplicated: Secondary | ICD-10-CM | POA: Diagnosis not present

## 2020-08-19 DIAGNOSIS — C50411 Malignant neoplasm of upper-outer quadrant of right female breast: Secondary | ICD-10-CM | POA: Insufficient documentation

## 2020-08-19 DIAGNOSIS — Z803 Family history of malignant neoplasm of breast: Secondary | ICD-10-CM | POA: Insufficient documentation

## 2020-08-19 DIAGNOSIS — Z806 Family history of leukemia: Secondary | ICD-10-CM | POA: Insufficient documentation

## 2020-08-19 DIAGNOSIS — Z170 Estrogen receptor positive status [ER+]: Secondary | ICD-10-CM

## 2020-08-19 DIAGNOSIS — Z7951 Long term (current) use of inhaled steroids: Secondary | ICD-10-CM | POA: Diagnosis not present

## 2020-08-19 DIAGNOSIS — G9332 Myalgic encephalomyelitis/chronic fatigue syndrome: Secondary | ICD-10-CM

## 2020-08-19 LAB — CBC WITH DIFFERENTIAL/PLATELET
Abs Immature Granulocytes: 0.02 10*3/uL (ref 0.00–0.07)
Basophils Absolute: 0.1 10*3/uL (ref 0.0–0.1)
Basophils Relative: 1 %
Eosinophils Absolute: 0.2 10*3/uL (ref 0.0–0.5)
Eosinophils Relative: 4 %
HCT: 42.1 % (ref 36.0–46.0)
Hemoglobin: 13.9 g/dL (ref 12.0–15.0)
Immature Granulocytes: 0 %
Lymphocytes Relative: 26 %
Lymphs Abs: 1.6 10*3/uL (ref 0.7–4.0)
MCH: 30.4 pg (ref 26.0–34.0)
MCHC: 33 g/dL (ref 30.0–36.0)
MCV: 92.1 fL (ref 80.0–100.0)
Monocytes Absolute: 0.4 10*3/uL (ref 0.1–1.0)
Monocytes Relative: 7 %
Neutro Abs: 4 10*3/uL (ref 1.7–7.7)
Neutrophils Relative %: 62 %
Platelets: 217 10*3/uL (ref 150–400)
RBC: 4.57 MIL/uL (ref 3.87–5.11)
RDW: 13.4 % (ref 11.5–15.5)
WBC: 6.3 10*3/uL (ref 4.0–10.5)
nRBC: 0 % (ref 0.0–0.2)

## 2020-08-19 LAB — COMPREHENSIVE METABOLIC PANEL
ALT: 19 U/L (ref 0–44)
AST: 19 U/L (ref 15–41)
Albumin: 3.9 g/dL (ref 3.5–5.0)
Alkaline Phosphatase: 56 U/L (ref 38–126)
Anion gap: 10 (ref 5–15)
BUN: 17 mg/dL (ref 8–23)
CO2: 27 mmol/L (ref 22–32)
Calcium: 8.8 mg/dL — ABNORMAL LOW (ref 8.9–10.3)
Chloride: 100 mmol/L (ref 98–111)
Creatinine, Ser: 0.65 mg/dL (ref 0.44–1.00)
GFR, Estimated: >60 mL/min (ref >60)
Glucose, Bld: 138 mg/dL — ABNORMAL HIGH (ref 70–99)
Potassium: 3.8 mmol/L (ref 3.5–5.1)
Sodium: 137 mmol/L (ref 135–145)
Total Bilirubin: 0.4 mg/dL (ref 0.3–1.2)
Total Protein: 6.8 g/dL (ref 6.5–8.1)

## 2020-08-20 LAB — CA 27.29 (SERIAL MONITOR): CA 27.29: 18.4 U/mL (ref 0.0–38.6)

## 2020-08-21 LAB — PROTEIN ELECTROPHORESIS, SERUM
A/G Ratio: 1.4 (ref 0.7–1.7)
Albumin ELP: 3.7 g/dL (ref 2.9–4.4)
Alpha-1-Globulin: 0.2 g/dL (ref 0.0–0.4)
Alpha-2-Globulin: 0.8 g/dL (ref 0.4–1.0)
Beta Globulin: 1 g/dL (ref 0.7–1.3)
Gamma Globulin: 0.7 g/dL (ref 0.4–1.8)
Globulin, Total: 2.6 g/dL (ref 2.2–3.9)
M-Spike, %: 0.2 g/dL — ABNORMAL HIGH
Total Protein ELP: 6.3 g/dL (ref 6.0–8.5)

## 2020-08-26 LAB — MISC LABCORP TEST (SEND OUT): Labcorp test code: 813940

## 2020-09-07 DIAGNOSIS — J4520 Mild intermittent asthma, uncomplicated: Secondary | ICD-10-CM | POA: Diagnosis not present

## 2020-09-07 DIAGNOSIS — J310 Chronic rhinitis: Secondary | ICD-10-CM | POA: Diagnosis not present

## 2020-09-14 DIAGNOSIS — K769 Liver disease, unspecified: Secondary | ICD-10-CM | POA: Diagnosis not present

## 2020-09-14 DIAGNOSIS — Z803 Family history of malignant neoplasm of breast: Secondary | ICD-10-CM | POA: Diagnosis not present

## 2020-09-14 DIAGNOSIS — N644 Mastodynia: Secondary | ICD-10-CM | POA: Diagnosis not present

## 2020-09-14 DIAGNOSIS — Z780 Asymptomatic menopausal state: Secondary | ICD-10-CM | POA: Diagnosis not present

## 2020-09-14 DIAGNOSIS — Z7401 Bed confinement status: Secondary | ICD-10-CM | POA: Diagnosis not present

## 2020-09-14 DIAGNOSIS — Z79811 Long term (current) use of aromatase inhibitors: Secondary | ICD-10-CM | POA: Diagnosis not present

## 2020-09-14 DIAGNOSIS — R42 Dizziness and giddiness: Secondary | ICD-10-CM | POA: Diagnosis not present

## 2020-09-14 DIAGNOSIS — J45909 Unspecified asthma, uncomplicated: Secondary | ICD-10-CM | POA: Diagnosis not present

## 2020-09-14 DIAGNOSIS — Z170 Estrogen receptor positive status [ER+]: Secondary | ICD-10-CM | POA: Diagnosis not present

## 2020-09-14 DIAGNOSIS — R5382 Chronic fatigue, unspecified: Secondary | ICD-10-CM | POA: Diagnosis not present

## 2020-09-14 DIAGNOSIS — Z6835 Body mass index (BMI) 35.0-35.9, adult: Secondary | ICD-10-CM | POA: Diagnosis not present

## 2020-09-14 DIAGNOSIS — F419 Anxiety disorder, unspecified: Secondary | ICD-10-CM | POA: Diagnosis not present

## 2020-09-14 DIAGNOSIS — Z7951 Long term (current) use of inhaled steroids: Secondary | ICD-10-CM | POA: Diagnosis not present

## 2020-09-14 DIAGNOSIS — C50911 Malignant neoplasm of unspecified site of right female breast: Secondary | ICD-10-CM | POA: Diagnosis not present

## 2020-09-14 DIAGNOSIS — H547 Unspecified visual loss: Secondary | ICD-10-CM | POA: Diagnosis not present

## 2020-09-14 DIAGNOSIS — C50411 Malignant neoplasm of upper-outer quadrant of right female breast: Secondary | ICD-10-CM | POA: Diagnosis not present

## 2020-09-24 DIAGNOSIS — R922 Inconclusive mammogram: Secondary | ICD-10-CM | POA: Diagnosis not present

## 2020-09-24 DIAGNOSIS — Z08 Encounter for follow-up examination after completed treatment for malignant neoplasm: Secondary | ICD-10-CM | POA: Diagnosis not present

## 2020-09-24 DIAGNOSIS — Z853 Personal history of malignant neoplasm of breast: Secondary | ICD-10-CM | POA: Diagnosis not present

## 2020-09-24 DIAGNOSIS — Z170 Estrogen receptor positive status [ER+]: Secondary | ICD-10-CM | POA: Diagnosis not present

## 2020-09-24 DIAGNOSIS — C50411 Malignant neoplasm of upper-outer quadrant of right female breast: Secondary | ICD-10-CM | POA: Diagnosis not present

## 2020-09-29 DIAGNOSIS — H43813 Vitreous degeneration, bilateral: Secondary | ICD-10-CM | POA: Diagnosis not present

## 2020-09-29 DIAGNOSIS — H31091 Other chorioretinal scars, right eye: Secondary | ICD-10-CM | POA: Diagnosis not present

## 2020-10-07 DIAGNOSIS — F430 Acute stress reaction: Secondary | ICD-10-CM | POA: Diagnosis not present

## 2020-10-07 DIAGNOSIS — Z01419 Encounter for gynecological examination (general) (routine) without abnormal findings: Secondary | ICD-10-CM | POA: Diagnosis not present

## 2020-10-23 DIAGNOSIS — M792 Neuralgia and neuritis, unspecified: Secondary | ICD-10-CM | POA: Diagnosis not present

## 2020-10-23 DIAGNOSIS — R42 Dizziness and giddiness: Secondary | ICD-10-CM | POA: Diagnosis not present

## 2020-10-23 DIAGNOSIS — G909 Disorder of the autonomic nervous system, unspecified: Secondary | ICD-10-CM | POA: Diagnosis not present

## 2020-11-04 DIAGNOSIS — F430 Acute stress reaction: Secondary | ICD-10-CM | POA: Diagnosis not present

## 2020-12-14 DIAGNOSIS — D472 Monoclonal gammopathy: Secondary | ICD-10-CM | POA: Diagnosis not present

## 2020-12-14 DIAGNOSIS — Z79899 Other long term (current) drug therapy: Secondary | ICD-10-CM | POA: Diagnosis not present

## 2020-12-14 DIAGNOSIS — K769 Liver disease, unspecified: Secondary | ICD-10-CM | POA: Diagnosis not present

## 2020-12-14 DIAGNOSIS — H547 Unspecified visual loss: Secondary | ICD-10-CM | POA: Diagnosis not present

## 2020-12-14 DIAGNOSIS — Z08 Encounter for follow-up examination after completed treatment for malignant neoplasm: Secondary | ICD-10-CM | POA: Diagnosis not present

## 2020-12-14 DIAGNOSIS — Z170 Estrogen receptor positive status [ER+]: Secondary | ICD-10-CM | POA: Diagnosis not present

## 2020-12-14 DIAGNOSIS — C50411 Malignant neoplasm of upper-outer quadrant of right female breast: Secondary | ICD-10-CM | POA: Diagnosis not present

## 2020-12-14 DIAGNOSIS — F419 Anxiety disorder, unspecified: Secondary | ICD-10-CM | POA: Diagnosis not present

## 2020-12-14 DIAGNOSIS — G933 Postviral fatigue syndrome: Secondary | ICD-10-CM | POA: Diagnosis not present

## 2020-12-14 DIAGNOSIS — R42 Dizziness and giddiness: Secondary | ICD-10-CM | POA: Diagnosis not present

## 2020-12-14 DIAGNOSIS — Z853 Personal history of malignant neoplasm of breast: Secondary | ICD-10-CM | POA: Diagnosis not present

## 2020-12-14 DIAGNOSIS — M1991 Primary osteoarthritis, unspecified site: Secondary | ICD-10-CM | POA: Diagnosis not present

## 2020-12-16 DIAGNOSIS — Z170 Estrogen receptor positive status [ER+]: Secondary | ICD-10-CM | POA: Diagnosis not present

## 2020-12-16 DIAGNOSIS — N6315 Unspecified lump in the right breast, overlapping quadrants: Secondary | ICD-10-CM | POA: Diagnosis not present

## 2020-12-16 DIAGNOSIS — C50411 Malignant neoplasm of upper-outer quadrant of right female breast: Secondary | ICD-10-CM | POA: Diagnosis not present

## 2020-12-16 DIAGNOSIS — Z853 Personal history of malignant neoplasm of breast: Secondary | ICD-10-CM | POA: Diagnosis not present

## 2020-12-16 DIAGNOSIS — R922 Inconclusive mammogram: Secondary | ICD-10-CM | POA: Diagnosis not present

## 2020-12-16 DIAGNOSIS — Z08 Encounter for follow-up examination after completed treatment for malignant neoplasm: Secondary | ICD-10-CM | POA: Diagnosis not present

## 2021-01-27 DIAGNOSIS — M25542 Pain in joints of left hand: Secondary | ICD-10-CM | POA: Diagnosis not present

## 2021-01-27 DIAGNOSIS — M25541 Pain in joints of right hand: Secondary | ICD-10-CM | POA: Diagnosis not present

## 2021-02-12 ENCOUNTER — Other Ambulatory Visit: Payer: Self-pay

## 2021-02-12 DIAGNOSIS — D472 Monoclonal gammopathy: Secondary | ICD-10-CM

## 2021-02-18 ENCOUNTER — Inpatient Hospital Stay (HOSPITAL_BASED_OUTPATIENT_CLINIC_OR_DEPARTMENT_OTHER): Payer: BC Managed Care – PPO | Admitting: Oncology

## 2021-02-18 ENCOUNTER — Other Ambulatory Visit: Payer: Self-pay

## 2021-02-18 ENCOUNTER — Encounter: Payer: Self-pay | Admitting: Oncology

## 2021-02-18 ENCOUNTER — Inpatient Hospital Stay: Payer: BC Managed Care – PPO | Attending: Oncology

## 2021-02-18 VITALS — BP 121/67 | HR 74 | Temp 96.4°F | Resp 18 | Wt 197.0 lb

## 2021-02-18 DIAGNOSIS — C50411 Malignant neoplasm of upper-outer quadrant of right female breast: Secondary | ICD-10-CM | POA: Insufficient documentation

## 2021-02-18 DIAGNOSIS — Z803 Family history of malignant neoplasm of breast: Secondary | ICD-10-CM | POA: Insufficient documentation

## 2021-02-18 DIAGNOSIS — Z170 Estrogen receptor positive status [ER+]: Secondary | ICD-10-CM | POA: Diagnosis not present

## 2021-02-18 DIAGNOSIS — Z806 Family history of leukemia: Secondary | ICD-10-CM | POA: Diagnosis not present

## 2021-02-18 DIAGNOSIS — G0490 Encephalitis and encephalomyelitis, unspecified: Secondary | ICD-10-CM | POA: Insufficient documentation

## 2021-02-18 DIAGNOSIS — D472 Monoclonal gammopathy: Secondary | ICD-10-CM | POA: Diagnosis not present

## 2021-02-18 DIAGNOSIS — Z79811 Long term (current) use of aromatase inhibitors: Secondary | ICD-10-CM | POA: Insufficient documentation

## 2021-02-18 LAB — COMPREHENSIVE METABOLIC PANEL
ALT: 22 U/L (ref 0–44)
AST: 17 U/L (ref 15–41)
Albumin: 4.1 g/dL (ref 3.5–5.0)
Alkaline Phosphatase: 58 U/L (ref 38–126)
Anion gap: 6 (ref 5–15)
BUN: 15 mg/dL (ref 8–23)
CO2: 29 mmol/L (ref 22–32)
Calcium: 8.8 mg/dL — ABNORMAL LOW (ref 8.9–10.3)
Chloride: 104 mmol/L (ref 98–111)
Creatinine, Ser: 0.54 mg/dL (ref 0.44–1.00)
GFR, Estimated: >60 mL/min (ref >60)
Glucose, Bld: 101 mg/dL — ABNORMAL HIGH (ref 70–99)
Potassium: 3.7 mmol/L (ref 3.5–5.1)
Sodium: 139 mmol/L (ref 135–145)
Total Bilirubin: 0.6 mg/dL (ref 0.3–1.2)
Total Protein: 7 g/dL (ref 6.5–8.1)

## 2021-02-18 LAB — CBC WITH DIFFERENTIAL/PLATELET
Abs Immature Granulocytes: 0.03 10*3/uL (ref 0.00–0.07)
Basophils Absolute: 0 10*3/uL (ref 0.0–0.1)
Basophils Relative: 1 %
Eosinophils Absolute: 0.2 10*3/uL (ref 0.0–0.5)
Eosinophils Relative: 3 %
HCT: 42.1 % (ref 36.0–46.0)
Hemoglobin: 13.9 g/dL (ref 12.0–15.0)
Immature Granulocytes: 1 %
Lymphocytes Relative: 29 %
Lymphs Abs: 1.8 10*3/uL (ref 0.7–4.0)
MCH: 30.3 pg (ref 26.0–34.0)
MCHC: 33 g/dL (ref 30.0–36.0)
MCV: 91.7 fL (ref 80.0–100.0)
Monocytes Absolute: 0.5 10*3/uL (ref 0.1–1.0)
Monocytes Relative: 8 %
Neutro Abs: 3.6 10*3/uL (ref 1.7–7.7)
Neutrophils Relative %: 58 %
Platelets: 236 10*3/uL (ref 150–400)
RBC: 4.59 MIL/uL (ref 3.87–5.11)
RDW: 13.9 % (ref 11.5–15.5)
WBC: 6.2 10*3/uL (ref 4.0–10.5)
nRBC: 0 % (ref 0.0–0.2)

## 2021-02-18 NOTE — Progress Notes (Signed)
[facility]  [address], [address] [place], [place] [address] Phone: [phone]  Fax: [phone]   Clinic Day:  02/18/2021  Referring physician: Hortencia Pilar, MD  Chief Complaint: Jeanne Anderson is a 60 y.o. female  presents for follow up of monoclonal gammopathy of unknown significance (MGUS) and stage IA right breast cancer.   PERTINENT ONCOLOGY HISTORY Patient previously followed up by Dr.Corcoran, patient switched care to me on 02/18/21 Extensive medical record review was performed by me  # monoclonal gammopathy of unknown significance (MGUS).  She was noted to have a small monoclonal protein in 11/2016.  PET scan on 01/12/2017 revealed no evidence of hypermetabolic neoplastic disease.  She declined bone marrow biopsy.  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  She was diagnosed with myalgic encephalomyelitis.  Patient follows up with Dr. Nigel Bridgeman [facility] neurology) She is on Florinef 0.1 mg was prescribed for dizziness autonomic dysfunction.  She was to continue Adderall $RemoveBeforeDEI'5mg'ymwCGKlomCNjDmct$  XR daily PRN  for cognitive dysfunction. Patient is also on gabapentin for neuropathic pain.  08/19/2020 She has a relative who was diagnosed with paraneoplastic cerebellar degeneration. Dr.Corcoran ordered paraneoplastic panel was negative (anti-Hu antibodies, anti-Ri antibodies).   Most of her breast cancer care is at [facility], followed by [place].  11/04/2019 Stage IA right breast cancer s/p right partial mastectomy .  Right breast biopsy on 10/11/2019 revealed grade II DCIS with < 38mm microscopic focus of IDC (pT34mi pN0).    Final pathology revealed negative margins(en face medial margin; 1 mm from blue inked margin,; > 2 mm from all other margins).  DCIS spanned 32 mm.  One sentinel lymph node was negative.  Pathologic stage was [id number]. ER 100%, PR 100%, HER2 negative.  Patient received adjuvant radiation at  [facility]. Patient is currently on letrozole treatments.  She initially developed dizziness and letrozole was interrupted.  Resumed on letrozole 03/04/2020 and has been tolerating well since then. Baseline bone density 01/2020 showed normal bone density. Patient is getting every 6 months breast imaging at [facility].  04/17/2020 Right diagnostic mammogram at [facility] revealed BI-RADS Category: 2-Mammo67mo : Benign. Mammography in 6 months was recommended.  04/21/2020 Abdomen CT with contrast revealed no evidence of metastatic disease in the abdomen or pelvis.  # [facility] germline testing was negative.      Today patient reports feeling well.  She takes letrozole with no significant side effects.  Chronic neurological symptoms, follows with [facility]. She is a Probation officer.  Past Medical History:  Diagnosis Date   Anxiety    Arthritis    Asthma    Dizziness    Fibromyalgia    History of Epstein-Barr virus infection    Memory changes    Muscular pain    Numbness and tingling    Vision changes     Past Surgical History:  Procedure Laterality Date   ABDOMINAL HYSTERECTOMY     COLONOSCOPY  2016   HIP ARTHROPLASTY      Family History  Problem Relation Age of Onset   Cancer Mother    Leukemia Sister    Breast cancer Sister     Social History:  reports that she has never smoked. She has never used smokeless tobacco. She reports current alcohol use. She reports that she does not use drugs. She denies any exposure to radiation or toxins.  She is a [employer] officer.  She lives in [place]. The patient was alone today.  Allergies:  Allergies  Allergen Reactions   Lidocaine     Other reaction(s): Unknown Weird things go numb per pt.    Meloxicam     Other reaction(s): Other (See Comments) Other Reaction: OTHER REACTION    Current Medications: Current Outpatient Medications  Medication Sig Dispense Refill   ALPRAZolam (XANAX) 0.25 MG tablet Take 0.25 mg by mouth as needed.      amphetamine-dextroamphetamine (ADDERALL) 12.5 MG tablet Take by mouth.     beclomethasone (QVAR) 40 MCG/ACT inhaler Inhale into the lungs.     calcium carbonate (OS-CAL - DOSED IN MG OF ELEMENTAL CALCIUM) 1250 (500 Ca) MG tablet Take 1 tablet by mouth.     Cholecalciferol (VITAMIN D3) 5000 units CAPS Take 5,000 Units by mouth daily.     escitalopram (LEXAPRO) 10 MG tablet Take 1 tablet by mouth daily.     fludrocortisone (FLORINEF) 0.1 MG tablet Take 100 mcg by mouth daily.     Fluticasone Furoate (ARNUITY ELLIPTA) 100 MCG/ACT AEPB Inhale into the lungs.     gabapentin (NEURONTIN) 300 MG capsule Take 300 mg by mouth daily.   4   letrozole (FEMARA) 2.5 MG tablet Take 2.5 mg by mouth every morning.     triamcinolone (NASACORT) 55 MCG/ACT AERO nasal inhaler Place 1 spray into the nose daily.     vitamin B-12 (CYANOCOBALAMIN) 1000 MCG tablet Take 1,000 mcg by mouth daily.     vitamin E 180 MG (400 UNITS) capsule Take by mouth daily.     albuterol (PROVENTIL) (2.5 MG/3ML) 0.083% nebulizer solution Inhale into the lungs.     ibuprofen (ADVIL,MOTRIN) 800 MG tablet Take 800 mg by mouth as needed. (Patient not taking: No sig reported)     ondansetron (ZOFRAN) 8 MG tablet ondansetron HCl 8 mg tablet (Patient not taking: Reported on 02/18/2021)     ondansetron (ZOFRAN-ODT) 4 MG disintegrating tablet SMARTSIG:1 Tablet(s) By Mouth 4-5 Times Daily (Patient not taking: Reported on 02/18/2021)     No current facility-administered medications for this visit.    Review of Systems  Constitutional:  Positive for malaise/fatigue (post-exertoinal malaise). Negative for chills, diaphoresis, fever and weight loss (up 17 lbs).  HENT: Negative.  Negative for congestion, ear discharge, ear pain, hearing loss, nosebleeds, sinus pain, sore throat and tinnitus.   Eyes: Negative.  Negative for blurred vision.  Respiratory: Negative.  Negative for cough, hemoptysis, sputum production and shortness of breath.    Cardiovascular: Negative.  Negative for chest pain, palpitations and leg swelling.  Gastrointestinal:  Negative for abdominal pain, blood in stool, constipation, diarrhea, heartburn, melena, nausea (comes ago) and vomiting.  Genitourinary: Negative.  Negative for dysuria, frequency, hematuria and urgency.  Musculoskeletal:  Positive for joint pain (left hip). Negative for back pain, myalgias and neck pain.       Left thumb pain, radiating to left upper extremity.  Skin: Negative.  Negative for itching and rash.  Neurological:  Positive for dizziness (comes and goes). Negative for tingling, sensory change, weakness and headaches.       Myalgic encephalomyelitis.   Endo/Heme/Allergies: Negative.  Does not bruise/bleed easily.  Psychiatric/Behavioral: Negative.  Negative for depression and memory loss. The patient is not nervous/anxious and does not have insomnia.   All other systems reviewed and are negative. Performance status (ECOG): 1  Vital signs: Blood pressure 121/67, pulse 74, temperature (!) 96.4 F (35.8 C), resp. rate 18, weight 196 lb 15.7 oz (89.4 kg), SpO2 98 %.  Physical Exam Vitals and nursing note reviewed.  Constitutional:      General: She is not in acute distress.    Appearance: She is well-developed. She is not diaphoretic.  HENT:     Head: Normocephalic and atraumatic.     Mouth/Throat:     Mouth: Mucous membranes are moist.     Pharynx: Oropharynx is clear.  Eyes:     General: No scleral icterus.    Extraocular Movements: Extraocular movements intact.     Conjunctiva/sclera: Conjunctivae normal.     Pupils: Pupils are equal, round, and reactive to light.  Cardiovascular:     Rate and Rhythm: Normal rate and regular rhythm.     Heart sounds: Normal heart sounds. No murmur heard. Pulmonary:     Effort: Pulmonary effort is normal. No respiratory distress.     Breath sounds: Normal breath sounds. No wheezing or rales.  Chest:     Chest wall: No tenderness.   Abdominal:     General: Bowel sounds are normal. There is no distension.     Palpations: Abdomen is soft. There is no mass.     Tenderness: There is no abdominal tenderness. There is no guarding or rebound.  Musculoskeletal:        General: No swelling or tenderness. Normal range of motion.     Cervical back: Normal range of motion and neck supple.  Lymphadenopathy:     Head:     Right side of head: No preauricular, posterior auricular or occipital adenopathy.     Left side of head: No preauricular, posterior auricular or occipital adenopathy.     Cervical: No cervical adenopathy.     Upper Body:     Right upper body: No supraclavicular or axillary adenopathy.     Left upper body: No supraclavicular or axillary adenopathy.     Lower Body: No right inguinal adenopathy. No left inguinal adenopathy.  Skin:    General: Skin is warm and dry.  Neurological:     Mental Status: She is alert and oriented to person, place, and time.  Psychiatric:        Mood and Affect: Mood normal.   RADIOGRAPHIC STUDIES: I have personally reviewed the radiological images as listed and agreed with the findings in the report. No results found.  Laboratory studies CBC    Component Value Date/Time   WBC 6.2 02/18/2021 0933   RBC 4.59 02/18/2021 0933   HGB 13.9 02/18/2021 0933   HCT 42.1 02/18/2021 0933   PLT 236 02/18/2021 0933   MCV 91.7 02/18/2021 0933   MCH 30.3 02/18/2021 0933   MCHC 33.0 02/18/2021 0933   RDW 13.9 02/18/2021 0933   LYMPHSABS 1.8 02/18/2021 0933   MONOABS 0.5 02/18/2021 0933   EOSABS 0.2 02/18/2021 0933   BASOSABS 0.0 02/18/2021 0933   CMP Latest Ref Rng & Units 02/18/2021 08/19/2020 02/17/2020  Glucose 70 - 99 mg/dL 101(H) 138(H) 88  BUN 8 - 23 mg/dL $Remove'15 17 16  'rsHKouk$ Creatinine 0.44 - 1.00 mg/dL 0.54 0.65 0.63  Sodium 135 - 145 mmol/L 139 137 138  Potassium 3.5 - 5.1 mmol/L 3.7 3.8 3.8  Chloride 98 - 111 mmol/L 104 100 103  CO2 22 - 32 mmol/L $RemoveB'29 27 29  'QGbAzGhg$ Calcium 8.9 - 10.3 mg/dL  8.8(L) 8.8(L) 8.7(L)  Total Protein 6.5 - 8.1 g/dL 7.0 6.8 6.9  Total Bilirubin 0.3 - 1.2 mg/dL 0.6 0.4 0.7  Alkaline Phos 38 - 126 U/L 58 56 55  AST 15 - 41 U/L $Remo'17 19 17  'ORlHn$ ALT  0 - 44 U/L $Remo'22 19 20     'CjKIJ$ Assessment and Plan:    1. Monoclonal gammopathy of unknown significance (MGUS)   2. Malignant neoplasm of upper-outer quadrant of right breast in female, estrogen receptor positive ([place])   3. Aromatase inhibitor use     #Monoclonal gammopathy of unknown significance-IgM lambda light chain specificity.  Patient previously refused PET scan/bone marrow biopsy for further evaluation. Protein electrophoresis from today is still pending. 08/19/2020, M spike 0.2. Continue observation.  #History of stage I right breast cancer status post partial mastectomy/sentinel lymph node biopsy-status post adjuvant radiation Currently on antiestrogen treatment with letrozole.  Mainly managed by [facility] oncology. Labs reviewed and discussed with patient Continue letrozole.  Patient get every 6 months Bone density in 2021 was normal.  Recommend patient to take calcium and vitamin D supplementation.  #  myalgic encephalomyelitis.  She is followed by Dr. Nigel Bridgeman at [facility].   RTC in 6 months for MD assess and labs (CBC with diff, CMP, SPEP, .  I discussed the assessment and treatment plan with the patient.  The patient was provided an opportunity to ask questions and all were answered.  The patient agreed with the plan and demonstrated an understanding of the instructions.  The patient was advised to call back if the symptoms worsen or if the condition fails to improve as anticipated.  We spent sufficient time to discuss many aspect of care, questions were answered to patient's satisfaction. A total of 40 minutes was spent on this visit.  With 15 minutes spent reviewing image findings, pathology reports, 20 minutes counseling the patient on the diagnosis, treatment plan and duration, side effects of the treatment,  management of symptoms.  Additional 5 minutes was spent on answering patient's questions.  Earlie Server, MD, PhD Hematology Oncology [place] at [facility] 02/18/2021

## 2021-02-19 LAB — CANCER ANTIGEN 27.29: CA 27.29: 15 U/mL (ref 0.0–38.6)

## 2021-02-22 LAB — MULTIPLE MYELOMA PANEL, SERUM
Albumin SerPl Elph-Mcnc: 3.6 g/dL (ref 2.9–4.4)
Albumin/Glob SerPl: 1.4 (ref 0.7–1.7)
Alpha 1: 0.2 g/dL (ref 0.0–0.4)
Alpha2 Glob SerPl Elph-Mcnc: 0.7 g/dL (ref 0.4–1.0)
B-Globulin SerPl Elph-Mcnc: 1 g/dL (ref 0.7–1.3)
Gamma Glob SerPl Elph-Mcnc: 0.7 g/dL (ref 0.4–1.8)
Globulin, Total: 2.6 g/dL (ref 2.2–3.9)
IgA: 70 mg/dL — ABNORMAL LOW (ref 87–352)
IgG (Immunoglobin G), Serum: 605 mg/dL (ref 586–1602)
IgM (Immunoglobulin M), Srm: 199 mg/dL (ref 26–217)
M Protein SerPl Elph-Mcnc: 0.2 g/dL — ABNORMAL HIGH
Total Protein ELP: 6.2 g/dL (ref 6.0–8.5)

## 2021-03-08 DIAGNOSIS — J4520 Mild intermittent asthma, uncomplicated: Secondary | ICD-10-CM | POA: Diagnosis not present

## 2021-03-08 DIAGNOSIS — J310 Chronic rhinitis: Secondary | ICD-10-CM | POA: Diagnosis not present

## 2021-03-23 DIAGNOSIS — Z79899 Other long term (current) drug therapy: Secondary | ICD-10-CM | POA: Diagnosis not present

## 2021-03-23 DIAGNOSIS — G9689 Other specified disorders of central nervous system: Secondary | ICD-10-CM | POA: Diagnosis not present

## 2021-03-23 DIAGNOSIS — D0511 Intraductal carcinoma in situ of right breast: Secondary | ICD-10-CM | POA: Diagnosis not present

## 2021-03-23 DIAGNOSIS — Z6837 Body mass index (BMI) 37.0-37.9, adult: Secondary | ICD-10-CM | POA: Diagnosis not present

## 2021-03-23 DIAGNOSIS — M797 Fibromyalgia: Secondary | ICD-10-CM | POA: Diagnosis not present

## 2021-03-23 DIAGNOSIS — Z803 Family history of malignant neoplasm of breast: Secondary | ICD-10-CM | POA: Diagnosis not present

## 2021-03-23 DIAGNOSIS — Z170 Estrogen receptor positive status [ER+]: Secondary | ICD-10-CM | POA: Diagnosis not present

## 2021-03-23 DIAGNOSIS — C50911 Malignant neoplasm of unspecified site of right female breast: Secondary | ICD-10-CM | POA: Diagnosis not present

## 2021-03-23 DIAGNOSIS — R920 Mammographic microcalcification found on diagnostic imaging of breast: Secondary | ICD-10-CM | POA: Diagnosis not present

## 2021-03-23 DIAGNOSIS — Z90721 Acquired absence of ovaries, unilateral: Secondary | ICD-10-CM | POA: Diagnosis not present

## 2021-03-23 DIAGNOSIS — Z90710 Acquired absence of both cervix and uterus: Secondary | ICD-10-CM | POA: Diagnosis not present

## 2021-03-23 DIAGNOSIS — C50811 Malignant neoplasm of overlapping sites of right female breast: Secondary | ICD-10-CM | POA: Diagnosis not present

## 2021-04-05 DIAGNOSIS — Z6835 Body mass index (BMI) 35.0-35.9, adult: Secondary | ICD-10-CM | POA: Diagnosis not present

## 2021-05-05 DIAGNOSIS — J40 Bronchitis, not specified as acute or chronic: Secondary | ICD-10-CM | POA: Diagnosis not present

## 2021-05-05 DIAGNOSIS — Z6835 Body mass index (BMI) 35.0-35.9, adult: Secondary | ICD-10-CM | POA: Diagnosis not present

## 2021-06-23 DIAGNOSIS — Z20822 Contact with and (suspected) exposure to covid-19: Secondary | ICD-10-CM | POA: Diagnosis not present

## 2021-06-23 DIAGNOSIS — Z20828 Contact with and (suspected) exposure to other viral communicable diseases: Secondary | ICD-10-CM | POA: Diagnosis not present

## 2021-06-23 DIAGNOSIS — J0100 Acute maxillary sinusitis, unspecified: Secondary | ICD-10-CM | POA: Diagnosis not present

## 2021-07-19 DIAGNOSIS — Z9011 Acquired absence of right breast and nipple: Secondary | ICD-10-CM | POA: Diagnosis not present

## 2021-07-19 DIAGNOSIS — Z9221 Personal history of antineoplastic chemotherapy: Secondary | ICD-10-CM | POA: Diagnosis not present

## 2021-07-19 DIAGNOSIS — Z7951 Long term (current) use of inhaled steroids: Secondary | ICD-10-CM | POA: Diagnosis not present

## 2021-07-19 DIAGNOSIS — Z8616 Personal history of COVID-19: Secondary | ICD-10-CM | POA: Diagnosis not present

## 2021-07-19 DIAGNOSIS — Z170 Estrogen receptor positive status [ER+]: Secondary | ICD-10-CM | POA: Diagnosis not present

## 2021-07-19 DIAGNOSIS — Z79811 Long term (current) use of aromatase inhibitors: Secondary | ICD-10-CM | POA: Diagnosis not present

## 2021-07-19 DIAGNOSIS — Z803 Family history of malignant neoplasm of breast: Secondary | ICD-10-CM | POA: Diagnosis not present

## 2021-07-19 DIAGNOSIS — D472 Monoclonal gammopathy: Secondary | ICD-10-CM | POA: Diagnosis not present

## 2021-07-19 DIAGNOSIS — C50411 Malignant neoplasm of upper-outer quadrant of right female breast: Secondary | ICD-10-CM | POA: Diagnosis not present

## 2021-07-19 DIAGNOSIS — F419 Anxiety disorder, unspecified: Secondary | ICD-10-CM | POA: Diagnosis not present

## 2021-07-19 DIAGNOSIS — Z79899 Other long term (current) drug therapy: Secondary | ICD-10-CM | POA: Diagnosis not present

## 2021-07-29 ENCOUNTER — Telehealth: Payer: Self-pay | Admitting: Oncology

## 2021-07-29 NOTE — Telephone Encounter (Signed)
Pt called to reschedule her appt for 2-14 and 2-16. Call back at [phone]

## 2021-07-29 NOTE — Telephone Encounter (Signed)
Pt stated that she wanted " all the labs" done at her appt. She stated last visit Dr. Tasia Catchings told her she didn't need the labs that were requested and she would prefer to have them done due to past oncologist's requests.

## 2021-08-02 ENCOUNTER — Telehealth: Payer: Self-pay | Admitting: Oncology

## 2021-08-02 NOTE — Telephone Encounter (Signed)
Pt called to reschedule her appt for 3-1 and 3-6. Call back at [phone]

## 2021-08-02 NOTE — Telephone Encounter (Signed)
Pt requested to cxl appts due to going to [facility] for further care

## 2021-08-24 ENCOUNTER — Other Ambulatory Visit: Payer: BC Managed Care – PPO

## 2021-08-26 ENCOUNTER — Ambulatory Visit: Payer: BC Managed Care – PPO | Admitting: Oncology

## 2021-09-07 DIAGNOSIS — J4520 Mild intermittent asthma, uncomplicated: Secondary | ICD-10-CM | POA: Diagnosis not present

## 2021-09-07 DIAGNOSIS — J310 Chronic rhinitis: Secondary | ICD-10-CM | POA: Diagnosis not present

## 2021-09-08 ENCOUNTER — Other Ambulatory Visit: Payer: BC Managed Care – PPO

## 2021-09-13 ENCOUNTER — Ambulatory Visit: Payer: BC Managed Care – PPO | Admitting: Oncology

## 2021-09-14 DIAGNOSIS — D472 Monoclonal gammopathy: Secondary | ICD-10-CM | POA: Diagnosis not present

## 2021-09-14 DIAGNOSIS — C50411 Malignant neoplasm of upper-outer quadrant of right female breast: Secondary | ICD-10-CM | POA: Diagnosis not present

## 2021-09-14 DIAGNOSIS — C9000 Multiple myeloma not having achieved remission: Secondary | ICD-10-CM | POA: Diagnosis not present

## 2021-09-14 DIAGNOSIS — Z6837 Body mass index (BMI) 37.0-37.9, adult: Secondary | ICD-10-CM | POA: Diagnosis not present

## 2021-09-14 DIAGNOSIS — Z170 Estrogen receptor positive status [ER+]: Secondary | ICD-10-CM | POA: Diagnosis not present

## 2021-10-13 DIAGNOSIS — K621 Rectal polyp: Secondary | ICD-10-CM | POA: Diagnosis not present

## 2021-10-13 DIAGNOSIS — Z86010 Personal history of colonic polyps: Secondary | ICD-10-CM | POA: Diagnosis not present

## 2021-10-13 DIAGNOSIS — C50411 Malignant neoplasm of upper-outer quadrant of right female breast: Secondary | ICD-10-CM | POA: Diagnosis not present

## 2021-10-13 DIAGNOSIS — Z79899 Other long term (current) drug therapy: Secondary | ICD-10-CM | POA: Diagnosis not present

## 2021-10-13 DIAGNOSIS — D128 Benign neoplasm of rectum: Secondary | ICD-10-CM | POA: Diagnosis not present

## 2021-10-13 DIAGNOSIS — Z888 Allergy status to other drugs, medicaments and biological substances status: Secondary | ICD-10-CM | POA: Diagnosis not present

## 2021-10-13 DIAGNOSIS — J45909 Unspecified asthma, uncomplicated: Secondary | ICD-10-CM | POA: Diagnosis not present

## 2021-10-13 DIAGNOSIS — Z1211 Encounter for screening for malignant neoplasm of colon: Secondary | ICD-10-CM | POA: Diagnosis not present

## 2021-10-13 DIAGNOSIS — Z91018 Allergy to other foods: Secondary | ICD-10-CM | POA: Diagnosis not present

## 2021-10-20 DIAGNOSIS — C50811 Malignant neoplasm of overlapping sites of right female breast: Secondary | ICD-10-CM | POA: Diagnosis not present

## 2021-10-20 DIAGNOSIS — Z170 Estrogen receptor positive status [ER+]: Secondary | ICD-10-CM | POA: Diagnosis not present

## 2021-10-20 DIAGNOSIS — Z79811 Long term (current) use of aromatase inhibitors: Secondary | ICD-10-CM | POA: Diagnosis not present

## 2021-10-20 DIAGNOSIS — Z9010 Acquired absence of unspecified breast and nipple: Secondary | ICD-10-CM | POA: Diagnosis not present

## 2021-10-20 DIAGNOSIS — Z08 Encounter for follow-up examination after completed treatment for malignant neoplasm: Secondary | ICD-10-CM | POA: Diagnosis not present

## 2021-10-20 DIAGNOSIS — Z853 Personal history of malignant neoplasm of breast: Secondary | ICD-10-CM | POA: Diagnosis not present

## 2021-10-20 DIAGNOSIS — Z6837 Body mass index (BMI) 37.0-37.9, adult: Secondary | ICD-10-CM | POA: Diagnosis not present

## 2021-10-20 DIAGNOSIS — Q796 Ehlers-Danlos syndrome, unspecified: Secondary | ICD-10-CM | POA: Diagnosis not present

## 2021-10-20 DIAGNOSIS — G9332 Myalgic encephalomyelitis/chronic fatigue syndrome: Secondary | ICD-10-CM | POA: Diagnosis not present

## 2021-11-02 DIAGNOSIS — C50411 Malignant neoplasm of upper-outer quadrant of right female breast: Secondary | ICD-10-CM | POA: Diagnosis not present

## 2021-11-02 DIAGNOSIS — M7989 Other specified soft tissue disorders: Secondary | ICD-10-CM | POA: Diagnosis not present

## 2021-11-02 DIAGNOSIS — R609 Edema, unspecified: Secondary | ICD-10-CM | POA: Diagnosis not present

## 2021-11-02 DIAGNOSIS — R600 Localized edema: Secondary | ICD-10-CM | POA: Diagnosis not present

## 2021-11-02 DIAGNOSIS — Z170 Estrogen receptor positive status [ER+]: Secondary | ICD-10-CM | POA: Diagnosis not present

## 2021-11-15 DIAGNOSIS — M25561 Pain in right knee: Secondary | ICD-10-CM | POA: Diagnosis not present

## 2021-11-15 DIAGNOSIS — M25562 Pain in left knee: Secondary | ICD-10-CM | POA: Diagnosis not present

## 2021-11-19 DIAGNOSIS — M25561 Pain in right knee: Secondary | ICD-10-CM | POA: Diagnosis not present

## 2021-11-19 DIAGNOSIS — M25541 Pain in joints of right hand: Secondary | ICD-10-CM | POA: Diagnosis not present

## 2021-11-19 DIAGNOSIS — M25562 Pain in left knee: Secondary | ICD-10-CM | POA: Diagnosis not present

## 2021-11-19 DIAGNOSIS — R262 Difficulty in walking, not elsewhere classified: Secondary | ICD-10-CM | POA: Diagnosis not present

## 2021-11-30 DIAGNOSIS — R262 Difficulty in walking, not elsewhere classified: Secondary | ICD-10-CM | POA: Diagnosis not present

## 2021-11-30 DIAGNOSIS — M25561 Pain in right knee: Secondary | ICD-10-CM | POA: Diagnosis not present

## 2021-11-30 DIAGNOSIS — M25562 Pain in left knee: Secondary | ICD-10-CM | POA: Diagnosis not present

## 2021-11-30 DIAGNOSIS — M25541 Pain in joints of right hand: Secondary | ICD-10-CM | POA: Diagnosis not present

## 2021-12-02 DIAGNOSIS — M25561 Pain in right knee: Secondary | ICD-10-CM | POA: Diagnosis not present

## 2021-12-02 DIAGNOSIS — R262 Difficulty in walking, not elsewhere classified: Secondary | ICD-10-CM | POA: Diagnosis not present

## 2021-12-02 DIAGNOSIS — M25562 Pain in left knee: Secondary | ICD-10-CM | POA: Diagnosis not present

## 2021-12-02 DIAGNOSIS — M25541 Pain in joints of right hand: Secondary | ICD-10-CM | POA: Diagnosis not present

## 2021-12-07 DIAGNOSIS — R262 Difficulty in walking, not elsewhere classified: Secondary | ICD-10-CM | POA: Diagnosis not present

## 2021-12-07 DIAGNOSIS — M25561 Pain in right knee: Secondary | ICD-10-CM | POA: Diagnosis not present

## 2021-12-07 DIAGNOSIS — M25541 Pain in joints of right hand: Secondary | ICD-10-CM | POA: Diagnosis not present

## 2021-12-07 DIAGNOSIS — M25562 Pain in left knee: Secondary | ICD-10-CM | POA: Diagnosis not present

## 2021-12-09 DIAGNOSIS — M1811 Unilateral primary osteoarthritis of first carpometacarpal joint, right hand: Secondary | ICD-10-CM | POA: Diagnosis not present

## 2021-12-09 DIAGNOSIS — M654 Radial styloid tenosynovitis [de Quervain]: Secondary | ICD-10-CM | POA: Diagnosis not present

## 2021-12-14 DIAGNOSIS — M25562 Pain in left knee: Secondary | ICD-10-CM | POA: Diagnosis not present

## 2021-12-14 DIAGNOSIS — R262 Difficulty in walking, not elsewhere classified: Secondary | ICD-10-CM | POA: Diagnosis not present

## 2021-12-14 DIAGNOSIS — M25561 Pain in right knee: Secondary | ICD-10-CM | POA: Diagnosis not present

## 2021-12-14 DIAGNOSIS — M25541 Pain in joints of right hand: Secondary | ICD-10-CM | POA: Diagnosis not present

## 2021-12-21 DIAGNOSIS — M25541 Pain in joints of right hand: Secondary | ICD-10-CM | POA: Diagnosis not present

## 2021-12-21 DIAGNOSIS — M25561 Pain in right knee: Secondary | ICD-10-CM | POA: Diagnosis not present

## 2021-12-21 DIAGNOSIS — M25562 Pain in left knee: Secondary | ICD-10-CM | POA: Diagnosis not present

## 2021-12-21 DIAGNOSIS — R262 Difficulty in walking, not elsewhere classified: Secondary | ICD-10-CM | POA: Diagnosis not present

## 2021-12-22 DIAGNOSIS — M1612 Unilateral primary osteoarthritis, left hip: Secondary | ICD-10-CM | POA: Diagnosis not present

## 2021-12-22 DIAGNOSIS — S76012A Strain of muscle, fascia and tendon of left hip, initial encounter: Secondary | ICD-10-CM | POA: Diagnosis not present

## 2021-12-28 DIAGNOSIS — H43813 Vitreous degeneration, bilateral: Secondary | ICD-10-CM | POA: Diagnosis not present

## 2021-12-28 DIAGNOSIS — H31091 Other chorioretinal scars, right eye: Secondary | ICD-10-CM | POA: Diagnosis not present

## 2021-12-29 DIAGNOSIS — Z01419 Encounter for gynecological examination (general) (routine) without abnormal findings: Secondary | ICD-10-CM | POA: Diagnosis not present

## 2021-12-29 DIAGNOSIS — Z9011 Acquired absence of right breast and nipple: Secondary | ICD-10-CM | POA: Diagnosis not present

## 2021-12-29 DIAGNOSIS — N644 Mastodynia: Secondary | ICD-10-CM | POA: Diagnosis not present

## 2021-12-29 DIAGNOSIS — Z1331 Encounter for screening for depression: Secondary | ICD-10-CM | POA: Diagnosis not present

## 2021-12-30 DIAGNOSIS — M654 Radial styloid tenosynovitis [de Quervain]: Secondary | ICD-10-CM | POA: Diagnosis not present

## 2021-12-30 DIAGNOSIS — M1811 Unilateral primary osteoarthritis of first carpometacarpal joint, right hand: Secondary | ICD-10-CM | POA: Diagnosis not present

## 2022-01-13 DIAGNOSIS — R262 Difficulty in walking, not elsewhere classified: Secondary | ICD-10-CM | POA: Diagnosis not present

## 2022-01-13 DIAGNOSIS — M25562 Pain in left knee: Secondary | ICD-10-CM | POA: Diagnosis not present

## 2022-01-13 DIAGNOSIS — M25541 Pain in joints of right hand: Secondary | ICD-10-CM | POA: Diagnosis not present

## 2022-01-13 DIAGNOSIS — M25561 Pain in right knee: Secondary | ICD-10-CM | POA: Diagnosis not present

## 2022-01-17 DIAGNOSIS — Z79811 Long term (current) use of aromatase inhibitors: Secondary | ICD-10-CM | POA: Diagnosis not present

## 2022-01-17 DIAGNOSIS — C50411 Malignant neoplasm of upper-outer quadrant of right female breast: Secondary | ICD-10-CM | POA: Diagnosis not present

## 2022-01-17 DIAGNOSIS — Z170 Estrogen receptor positive status [ER+]: Secondary | ICD-10-CM | POA: Diagnosis not present

## 2022-01-24 DIAGNOSIS — Z6837 Body mass index (BMI) 37.0-37.9, adult: Secondary | ICD-10-CM | POA: Diagnosis not present

## 2022-01-24 DIAGNOSIS — Z79811 Long term (current) use of aromatase inhibitors: Secondary | ICD-10-CM | POA: Diagnosis not present

## 2022-01-24 DIAGNOSIS — Z803 Family history of malignant neoplasm of breast: Secondary | ICD-10-CM | POA: Diagnosis not present

## 2022-01-24 DIAGNOSIS — J45909 Unspecified asthma, uncomplicated: Secondary | ICD-10-CM | POA: Diagnosis not present

## 2022-01-24 DIAGNOSIS — Z780 Asymptomatic menopausal state: Secondary | ICD-10-CM | POA: Diagnosis not present

## 2022-01-24 DIAGNOSIS — M778 Other enthesopathies, not elsewhere classified: Secondary | ICD-10-CM | POA: Diagnosis not present

## 2022-01-24 DIAGNOSIS — G9332 Myalgic encephalomyelitis/chronic fatigue syndrome: Secondary | ICD-10-CM | POA: Diagnosis not present

## 2022-01-24 DIAGNOSIS — Z8616 Personal history of COVID-19: Secondary | ICD-10-CM | POA: Diagnosis not present

## 2022-01-24 DIAGNOSIS — K769 Liver disease, unspecified: Secondary | ICD-10-CM | POA: Diagnosis not present

## 2022-01-24 DIAGNOSIS — Z90721 Acquired absence of ovaries, unilateral: Secondary | ICD-10-CM | POA: Diagnosis not present

## 2022-01-24 DIAGNOSIS — C50411 Malignant neoplasm of upper-outer quadrant of right female breast: Secondary | ICD-10-CM | POA: Diagnosis not present

## 2022-01-24 DIAGNOSIS — F419 Anxiety disorder, unspecified: Secondary | ICD-10-CM | POA: Diagnosis not present

## 2022-01-24 DIAGNOSIS — N644 Mastodynia: Secondary | ICD-10-CM | POA: Diagnosis not present

## 2022-01-24 DIAGNOSIS — R42 Dizziness and giddiness: Secondary | ICD-10-CM | POA: Diagnosis not present

## 2022-01-24 DIAGNOSIS — H547 Unspecified visual loss: Secondary | ICD-10-CM | POA: Diagnosis not present

## 2022-01-24 DIAGNOSIS — Z170 Estrogen receptor positive status [ER+]: Secondary | ICD-10-CM | POA: Diagnosis not present

## 2022-01-24 DIAGNOSIS — Z90710 Acquired absence of both cervix and uterus: Secondary | ICD-10-CM | POA: Diagnosis not present

## 2022-01-24 DIAGNOSIS — Z923 Personal history of irradiation: Secondary | ICD-10-CM | POA: Diagnosis not present

## 2022-01-25 DIAGNOSIS — R262 Difficulty in walking, not elsewhere classified: Secondary | ICD-10-CM | POA: Diagnosis not present

## 2022-01-25 DIAGNOSIS — M25561 Pain in right knee: Secondary | ICD-10-CM | POA: Diagnosis not present

## 2022-01-25 DIAGNOSIS — M25562 Pain in left knee: Secondary | ICD-10-CM | POA: Diagnosis not present

## 2022-01-25 DIAGNOSIS — M25541 Pain in joints of right hand: Secondary | ICD-10-CM | POA: Diagnosis not present

## 2022-01-26 DIAGNOSIS — Z79811 Long term (current) use of aromatase inhibitors: Secondary | ICD-10-CM | POA: Diagnosis not present

## 2022-01-26 DIAGNOSIS — Z170 Estrogen receptor positive status [ER+]: Secondary | ICD-10-CM | POA: Diagnosis not present

## 2022-01-26 DIAGNOSIS — N644 Mastodynia: Secondary | ICD-10-CM | POA: Diagnosis not present

## 2022-01-26 DIAGNOSIS — C50411 Malignant neoplasm of upper-outer quadrant of right female breast: Secondary | ICD-10-CM | POA: Diagnosis not present

## 2022-02-01 DIAGNOSIS — J4520 Mild intermittent asthma, uncomplicated: Secondary | ICD-10-CM | POA: Diagnosis not present

## 2022-02-01 DIAGNOSIS — J309 Allergic rhinitis, unspecified: Secondary | ICD-10-CM | POA: Diagnosis not present

## 2022-02-08 DIAGNOSIS — R262 Difficulty in walking, not elsewhere classified: Secondary | ICD-10-CM | POA: Diagnosis not present

## 2022-02-08 DIAGNOSIS — M25541 Pain in joints of right hand: Secondary | ICD-10-CM | POA: Diagnosis not present

## 2022-02-08 DIAGNOSIS — M25561 Pain in right knee: Secondary | ICD-10-CM | POA: Diagnosis not present

## 2022-02-08 DIAGNOSIS — M25562 Pain in left knee: Secondary | ICD-10-CM | POA: Diagnosis not present

## 2022-02-09 DIAGNOSIS — R262 Difficulty in walking, not elsewhere classified: Secondary | ICD-10-CM | POA: Diagnosis not present

## 2022-02-09 DIAGNOSIS — M25562 Pain in left knee: Secondary | ICD-10-CM | POA: Diagnosis not present

## 2022-02-09 DIAGNOSIS — M25561 Pain in right knee: Secondary | ICD-10-CM | POA: Diagnosis not present

## 2022-02-09 DIAGNOSIS — M25541 Pain in joints of right hand: Secondary | ICD-10-CM | POA: Diagnosis not present

## 2022-02-21 DIAGNOSIS — M25562 Pain in left knee: Secondary | ICD-10-CM | POA: Diagnosis not present

## 2022-02-21 DIAGNOSIS — M25561 Pain in right knee: Secondary | ICD-10-CM | POA: Diagnosis not present

## 2022-02-21 DIAGNOSIS — M25541 Pain in joints of right hand: Secondary | ICD-10-CM | POA: Diagnosis not present

## 2022-02-21 DIAGNOSIS — R262 Difficulty in walking, not elsewhere classified: Secondary | ICD-10-CM | POA: Diagnosis not present

## 2022-03-16 DIAGNOSIS — D472 Monoclonal gammopathy: Secondary | ICD-10-CM | POA: Diagnosis not present

## 2022-03-16 DIAGNOSIS — Z6838 Body mass index (BMI) 38.0-38.9, adult: Secondary | ICD-10-CM | POA: Diagnosis not present

## 2022-03-21 DIAGNOSIS — G9332 Myalgic encephalomyelitis/chronic fatigue syndrome: Secondary | ICD-10-CM | POA: Diagnosis not present

## 2022-03-21 DIAGNOSIS — M7989 Other specified soft tissue disorders: Secondary | ICD-10-CM | POA: Diagnosis not present

## 2022-03-21 DIAGNOSIS — R0602 Shortness of breath: Secondary | ICD-10-CM | POA: Diagnosis not present

## 2022-03-21 DIAGNOSIS — M79606 Pain in leg, unspecified: Secondary | ICD-10-CM | POA: Diagnosis not present

## 2022-03-21 DIAGNOSIS — Z23 Encounter for immunization: Secondary | ICD-10-CM | POA: Diagnosis not present

## 2022-03-23 DIAGNOSIS — R0602 Shortness of breath: Secondary | ICD-10-CM | POA: Diagnosis not present

## 2022-04-06 DIAGNOSIS — M2550 Pain in unspecified joint: Secondary | ICD-10-CM | POA: Diagnosis not present

## 2022-04-06 DIAGNOSIS — R600 Localized edema: Secondary | ICD-10-CM | POA: Diagnosis not present

## 2022-04-06 DIAGNOSIS — G9332 Myalgic encephalomyelitis/chronic fatigue syndrome: Secondary | ICD-10-CM | POA: Diagnosis not present

## 2022-04-06 DIAGNOSIS — R202 Paresthesia of skin: Secondary | ICD-10-CM | POA: Diagnosis not present

## 2022-04-06 DIAGNOSIS — Z0000 Encounter for general adult medical examination without abnormal findings: Secondary | ICD-10-CM | POA: Diagnosis not present

## 2022-04-19 DIAGNOSIS — L820 Inflamed seborrheic keratosis: Secondary | ICD-10-CM | POA: Diagnosis not present

## 2022-04-19 DIAGNOSIS — D485 Neoplasm of uncertain behavior of skin: Secondary | ICD-10-CM | POA: Diagnosis not present

## 2022-05-04 DIAGNOSIS — G9332 Myalgic encephalomyelitis/chronic fatigue syndrome: Secondary | ICD-10-CM | POA: Diagnosis not present

## 2022-05-10 ENCOUNTER — Encounter (INDEPENDENT_AMBULATORY_CARE_PROVIDER_SITE_OTHER): Payer: Self-pay | Admitting: Nurse Practitioner

## 2022-05-10 ENCOUNTER — Other Ambulatory Visit (INDEPENDENT_AMBULATORY_CARE_PROVIDER_SITE_OTHER): Payer: Self-pay | Admitting: Nurse Practitioner

## 2022-05-10 ENCOUNTER — Ambulatory Visit (INDEPENDENT_AMBULATORY_CARE_PROVIDER_SITE_OTHER): Payer: BC Managed Care – PPO | Admitting: Nurse Practitioner

## 2022-05-10 VITALS — BP 141/80 | HR 73 | Resp 16 | Wt 201.8 lb

## 2022-05-10 DIAGNOSIS — M79669 Pain in unspecified lower leg: Secondary | ICD-10-CM

## 2022-05-10 DIAGNOSIS — G9332 Myalgic encephalomyelitis/chronic fatigue syndrome: Secondary | ICD-10-CM

## 2022-05-10 DIAGNOSIS — C50411 Malignant neoplasm of upper-outer quadrant of right female breast: Secondary | ICD-10-CM

## 2022-05-10 DIAGNOSIS — Z170 Estrogen receptor positive status [ER+]: Secondary | ICD-10-CM

## 2022-05-10 DIAGNOSIS — M7989 Other specified soft tissue disorders: Secondary | ICD-10-CM

## 2022-05-10 NOTE — Progress Notes (Signed)
Subjective:    Patient ID: Jeanne Anderson, female    DOB: Jul 24, 1960, 61 y.o.   MRN: [id number] Chief Complaint  Patient presents with   New Patient (Initial Visit)    Ref kalisetti consult bilateral leg edems    Jeanne Anderson is a 61 y.o. female new patient Patient presents to clinic today for swelling in her legs.   Patient with Myalgic encephalomyelitis syndrome and history of breast cancer. Was on a retreat back in April 2023 and noticed by others at the retreat who were nurses her swollen ankles/legs. She noticed swelling in her knees and has seen Ortho. She also has seen PT with inability to tolerate PT due to the swelling. She is unable to determine if the worsening pain is from the ME or the swelling in her legs. The backs of her knees are swollen and she has gained weight. She has tried to exercise but cannot like she wants due to the pain.    Patient also with MGUS.       Review of Systems  Constitutional: Negative.   HENT: Negative.    Respiratory: Negative.    Cardiovascular:  Positive for leg swelling.  Gastrointestinal: Negative.   Endocrine: Negative.   Genitourinary: Negative.   Musculoskeletal:  Positive for gait problem, joint swelling and myalgias.  Skin: Negative.   Allergic/Immunologic: Negative.   Hematological: Negative.   Psychiatric/Behavioral: Negative.         Objective:   Physical Exam Constitutional:      Appearance: Normal appearance.  Eyes:     Pupils: Pupils are equal, round, and reactive to light.  Cardiovascular:     Rate and Rhythm: Normal rate and regular rhythm.     Pulses: Normal pulses.  Pulmonary:     Effort: Pulmonary effort is normal.  Abdominal:     General: Bowel sounds are normal.     Palpations: Abdomen is soft.  Musculoskeletal:     Cervical back: Normal range of motion.     Right lower leg: Edema present.     Left lower leg: Edema present.  Skin:    General: Skin is warm and dry.     Capillary Refill:  Capillary refill takes 2 to 3 seconds.  Neurological:     General: No focal deficit present.     Mental Status: She is alert and oriented to person, place, and time.  Psychiatric:        Mood and Affect: Mood normal.        Behavior: Behavior normal.     BP (!) 141/80 (BP Location: Left Arm)   Pulse 73   Resp 16   Wt 201 lb 12.8 oz (91.5 kg)   BMI 36.91 kg/m   Past Medical History:  Diagnosis Date   Anxiety    Arthritis    Asthma    Dizziness    Fibromyalgia    History of Epstein-Barr virus infection    Memory changes    Muscular pain    Numbness and tingling    Vision changes     Social History   Socioeconomic History   Marital status: Married    Spouse name: Not on file   Number of children: Not on file   Years of education: Not on file   Highest education level: Not on file  Occupational History   Not on file  Tobacco Use   Smoking status: Never   Smokeless tobacco: Never  Vaping Use   Vaping Use:  Not on file  Substance and Sexual Activity   Alcohol use: Yes   Drug use: No   Sexual activity: Not on file  Other Topics Concern   Not on file  Social History Narrative   Not on file   Social Determinants of Health   Financial Resource Strain: Not on file  Food Insecurity: Not on file  Transportation Needs: Not on file  Physical Activity: Not on file  Stress: Not on file  Social Connections: Not on file  Intimate Partner Violence: Not on file    Past Surgical History:  Procedure Laterality Date   ABDOMINAL HYSTERECTOMY     COLONOSCOPY  2016   HIP ARTHROPLASTY      Family History  Problem Relation Age of Onset   Cancer Mother    Leukemia Sister    Breast cancer Sister     Allergies  Allergen Reactions   Other Anaphylaxis    mushrooms mushrooms    Lidocaine     Other reaction(s): Unknown Weird things go numb per pt.    Meloxicam     Other reaction(s): Other (See Comments) Other Reaction: OTHER REACTION       Latest Ref Rng &  Units 02/18/2021    9:33 AM 08/19/2020    9:51 AM 02/17/2020    9:45 AM  CBC  WBC 4.0 - 10.5 K/uL 6.2  6.3  6.2   Hemoglobin 12.0 - 15.0 g/dL 13.9  13.9  14.2   Hematocrit 36.0 - 46.0 % 42.1  42.1  43.2   Platelets 150 - 400 K/uL 236  217  245       CMP     Component Value Date/Time   NA 139 02/18/2021 0933   K 3.7 02/18/2021 0933   CL 104 02/18/2021 0933   CO2 29 02/18/2021 0933   GLUCOSE 101 (H) 02/18/2021 0933   BUN 15 02/18/2021 0933   CREATININE 0.54 02/18/2021 0933   CALCIUM 8.8 (L) 02/18/2021 0933   PROT 7.0 02/18/2021 0933   ALBUMIN 4.1 02/18/2021 0933   AST 17 02/18/2021 0933   ALT 22 02/18/2021 0933   ALKPHOS 58 02/18/2021 0933   BILITOT 0.6 02/18/2021 0933   GFRNONAA >60 02/18/2021 0933   GFRAA >60 02/17/2020 0945     No results found.     Assessment & Plan:   1. Pain and swelling of lower leg, unspecified laterality Recommend:  I have had a long discussion with the patient regarding swelling and why it  causes symptoms.  Patient will begin wearing graduated compression on a daily basis a prescription was given. The patient will  wear the stockings first thing in the morning and removing them in the evening. The patient is instructed specifically not to sleep in the stockings.   In addition, behavioral modification will be initiated.  This will include frequent elevation, use of over the counter pain medications and exercise such as walking as she is able to tolerate due to her ME  Patient should undergo duplex ultrasound of the venous system to ensure that DVT or reflux is not present. This will be scheduled today with a follow up appoint to discuss findings.   The patient will follow-up with me after the ultrasound.     2. Myalgic encephalomyelitis syndrome Continue Myalgic Encephalomylitis medications as already ordered and reviewed, no changes at this time.   3. Malignant neoplasm of upper-outer quadrant of right breast in female, estrogen receptor  positive ([place]) Continue breast cancer medications  as already ordered, these medications have been reviewed and there are no changes at this time.    Current Outpatient Medications on File Prior to Visit  Medication Sig Dispense Refill   albuterol (PROVENTIL) (2.5 MG/3ML) 0.083% nebulizer solution Inhale into the lungs.     ALPRAZolam (XANAX) 0.25 MG tablet Take 0.25 mg by mouth as needed.     amphetamine-dextroamphetamine (ADDERALL) 12.5 MG tablet Take by mouth.     calcium carbonate (OS-CAL - DOSED IN MG OF ELEMENTAL CALCIUM) 1250 (500 Ca) MG tablet Take 1 tablet by mouth.     Cholecalciferol (VITAMIN D3) 5000 units CAPS Take 5,000 Units by mouth daily.     DULoxetine (CYMBALTA) 30 MG capsule Take one capsule by mouth once daily for four weeks.  If tolerated but ineffective, increase to two capsules once daily     Fluticasone Furoate (ARNUITY ELLIPTA) 100 MCG/ACT AEPB Inhale into the lungs.     letrozole (FEMARA) 2.5 MG tablet Take 2.5 mg by mouth every morning.     triamcinolone (NASACORT) 55 MCG/ACT AERO nasal inhaler Place 1 spray into the nose daily.     vitamin B-12 (CYANOCOBALAMIN) 1000 MCG tablet Take 1,000 mcg by mouth daily.     vitamin E 180 MG (400 UNITS) capsule Take by mouth daily.     escitalopram (LEXAPRO) 10 MG tablet Take 1 tablet by mouth daily. (Patient not taking: Reported on 05/10/2022)     fludrocortisone (FLORINEF) 0.1 MG tablet Take 100 mcg by mouth daily. (Patient not taking: Reported on 05/10/2022)     gabapentin (NEURONTIN) 300 MG capsule Take 300 mg by mouth daily.  (Patient not taking: Reported on 05/10/2022)  4   ibuprofen (ADVIL,MOTRIN) 800 MG tablet Take 800 mg by mouth as needed. (Patient not taking: Reported on 02/17/2020)     ondansetron (ZOFRAN) 8 MG tablet ondansetron HCl 8 mg tablet (Patient not taking: Reported on 02/18/2021)     ondansetron (ZOFRAN-ODT) 4 MG disintegrating tablet SMARTSIG:1 Tablet(s) By Mouth 4-5 Times Daily (Patient not taking: Reported on  02/18/2021)     No current facility-administered medications on file prior to visit.    There are no Patient Instructions on file for this visit. No follow-ups on file.   Kris Hartmann, NP

## 2022-05-11 ENCOUNTER — Ambulatory Visit (INDEPENDENT_AMBULATORY_CARE_PROVIDER_SITE_OTHER): Payer: BC Managed Care – PPO

## 2022-05-11 DIAGNOSIS — M7989 Other specified soft tissue disorders: Secondary | ICD-10-CM | POA: Diagnosis not present

## 2022-05-11 DIAGNOSIS — M79669 Pain in unspecified lower leg: Secondary | ICD-10-CM | POA: Diagnosis not present

## 2022-05-17 ENCOUNTER — Encounter (INDEPENDENT_AMBULATORY_CARE_PROVIDER_SITE_OTHER): Payer: Self-pay | Admitting: Nurse Practitioner

## 2022-05-23 ENCOUNTER — Ambulatory Visit (INDEPENDENT_AMBULATORY_CARE_PROVIDER_SITE_OTHER): Payer: BC Managed Care – PPO | Admitting: Vascular Surgery

## 2022-05-23 ENCOUNTER — Encounter (INDEPENDENT_AMBULATORY_CARE_PROVIDER_SITE_OTHER): Payer: Self-pay | Admitting: Nurse Practitioner

## 2022-05-23 VITALS — BP 120/80 | HR 80 | Resp 16 | Wt 202.4 lb

## 2022-05-23 DIAGNOSIS — M25561 Pain in right knee: Secondary | ICD-10-CM

## 2022-05-23 DIAGNOSIS — M7989 Other specified soft tissue disorders: Secondary | ICD-10-CM

## 2022-05-23 DIAGNOSIS — G9332 Myalgic encephalomyelitis/chronic fatigue syndrome: Secondary | ICD-10-CM

## 2022-05-23 DIAGNOSIS — C50411 Malignant neoplasm of upper-outer quadrant of right female breast: Secondary | ICD-10-CM

## 2022-05-23 DIAGNOSIS — G8929 Other chronic pain: Secondary | ICD-10-CM

## 2022-05-23 DIAGNOSIS — M79669 Pain in unspecified lower leg: Secondary | ICD-10-CM | POA: Diagnosis not present

## 2022-05-23 DIAGNOSIS — M25562 Pain in left knee: Secondary | ICD-10-CM

## 2022-05-23 DIAGNOSIS — Z170 Estrogen receptor positive status [ER+]: Secondary | ICD-10-CM

## 2022-05-23 NOTE — Progress Notes (Signed)
Subjective:    Patient ID: Jeanne Anderson, female    DOB: 04-06-1961, 61 y.o.   MRN: [id number] Chief Complaint  Patient presents with   Follow-up    Ultrasound results    Patient presents to clinic today for follow-up of ultrasound studies with reflux studies related to pain and swelling of her bilateral lower extremities.  Patient states that the pain and the swelling has remained the same even though she has started to wear compression stockings, elevate her legs at night, and try to do more exercise.  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       Review of Systems  Constitutional: Negative.   HENT: Negative.    Respiratory: Negative.    Cardiovascular: Negative.   Gastrointestinal: Negative.   Endocrine: Negative.   Genitourinary: Negative.   Musculoskeletal:  Positive for arthralgias and joint swelling.  Skin: Negative.   Neurological: Negative.   Psychiatric/Behavioral: Negative.         Objective:   Physical Exam Constitutional:      Appearance: Normal appearance. She is obese.  Eyes:     Pupils: Pupils are equal, round, and reactive to light.  Cardiovascular:     Rate and Rhythm: Normal rate and regular rhythm.     Pulses: Normal pulses.  Pulmonary:     Effort: Pulmonary effort is normal.  Abdominal:     General: Bowel sounds are normal.     Palpations: Abdomen is soft.  Musculoskeletal:        General: Swelling and tenderness present.  Skin:    General: Skin is warm and dry.  Neurological:     General: No focal deficit present.     Mental Status: She is alert and oriented to person, place, and time.  Psychiatric:        Mood and Affect: Mood normal.        Behavior: Behavior normal.        Thought Content: Thought content normal.        Judgment: Judgment normal.    BP 120/80 (BP Location: Left Arm)   Pulse 80   Resp 16    Wt 202 lb 6.4 oz (91.8 kg)   BMI 37.02 kg/m   Past Medical History:  Diagnosis Date   Anxiety    Arthritis    Asthma    Dizziness    Fibromyalgia    History of Epstein-Barr virus infection    Memory changes    Muscular pain    Numbness and tingling    Vision changes     Social History   Socioeconomic History   Marital status: Married    Spouse name: Not on file   Number of children: Not on file   Years of education: Not on file   Highest education level: Not on file  Occupational History   Not on file  Tobacco Use   Smoking status: Never   Smokeless tobacco: Never  Vaping Use   Vaping Use: Not on file  Substance and Sexual Activity   Alcohol use: Yes   Drug use: No   Sexual activity: Not on file  Other Topics Concern   Not on file  Social History Narrative   Not on file   Social Determinants of Health   Financial Resource Strain: Not on file  Food Insecurity: Not on file  Transportation Needs: Not on  file  Physical Activity: Not on file  Stress: Not on file  Social Connections: Not on file  Intimate Partner Violence: Not on file    Past Surgical History:  Procedure Laterality Date   ABDOMINAL HYSTERECTOMY     COLONOSCOPY  2016   HIP ARTHROPLASTY      Family History  Problem Relation Age of Onset   Cancer Mother    Leukemia Sister    Breast cancer Sister     Allergies  Allergen Reactions   Other Anaphylaxis    mushrooms mushrooms    Lidocaine     Other reaction(s): Unknown Weird things go numb per pt.    Meloxicam     Other reaction(s): Other (See Comments) Other Reaction: OTHER REACTION       Latest Ref Rng & Units 02/18/2021    9:33 AM 08/19/2020    9:51 AM 02/17/2020    9:45 AM  CBC  WBC 4.0 - 10.5 K/uL 6.2  6.3  6.2   Hemoglobin 12.0 - 15.0 g/dL 13.9  13.9  14.2   Hematocrit 36.0 - 46.0 % 42.1  42.1  43.2   Platelets 150 - 400 K/uL 236  217  245       CMP     Component Value Date/Time   NA 139 02/18/2021 0933   K 3.7  02/18/2021 0933   CL 104 02/18/2021 0933   CO2 29 02/18/2021 0933   GLUCOSE 101 (H) 02/18/2021 0933   BUN 15 02/18/2021 0933   CREATININE 0.54 02/18/2021 0933   CALCIUM 8.8 (L) 02/18/2021 0933   PROT 7.0 02/18/2021 0933   ALBUMIN 4.1 02/18/2021 0933   AST 17 02/18/2021 0933   ALT 22 02/18/2021 0933   ALKPHOS 58 02/18/2021 0933   BILITOT 0.6 02/18/2021 0933   GFRNONAA >60 02/18/2021 0933   GFRAA >60 02/17/2020 0945     ULTRASOUND RESULTS: Right Lower Extremity has noted reflux in GSV at SFJ to GSV proximal Calf.   Left Lower Extremity has noted reflux in GSV distal thigh to the GSV at the knee.     Assessment & Plan:    Current Outpatient Medications on File Prior to Visit  Medication Sig Dispense Refill   albuterol (PROVENTIL) (2.5 MG/3ML) 0.083% nebulizer solution Inhale into the lungs.     ALPRAZolam (XANAX) 0.25 MG tablet Take 0.25 mg by mouth as needed.     amphetamine-dextroamphetamine (ADDERALL) 12.5 MG tablet Take by mouth.     calcium carbonate (OS-CAL - DOSED IN MG OF ELEMENTAL CALCIUM) 1250 (500 Ca) MG tablet Take 1 tablet by mouth.     Cholecalciferol (VITAMIN D3) 5000 units CAPS Take 5,000 Units by mouth daily.     DULoxetine (CYMBALTA) 30 MG capsule Take one capsule by mouth once daily for four weeks.  If tolerated but ineffective, increase to two capsules once daily     Fluticasone Furoate (ARNUITY ELLIPTA) 100 MCG/ACT AEPB Inhale into the lungs.     gabapentin (NEURONTIN) 300 MG capsule Take 300 mg by mouth daily.  4   triamcinolone (NASACORT) 55 MCG/ACT AERO nasal inhaler Place 1 spray into the nose daily.     vitamin B-12 (CYANOCOBALAMIN) 1000 MCG tablet Take 1,000 mcg by mouth daily.     vitamin E 180 MG (400 UNITS) capsule Take by mouth daily.     escitalopram (LEXAPRO) 10 MG tablet Take 1 tablet by mouth daily. (Patient not taking: Reported on 05/10/2022)     fludrocortisone (FLORINEF) 0.1 MG  tablet Take 100 mcg by mouth daily. (Patient not taking: Reported  on 05/10/2022)     ibuprofen (ADVIL,MOTRIN) 800 MG tablet Take 800 mg by mouth as needed. (Patient not taking: Reported on 02/17/2020)     letrozole (FEMARA) 2.5 MG tablet Take 2.5 mg by mouth every morning. (Patient not taking: Reported on 05/23/2022)     ondansetron (ZOFRAN) 8 MG tablet ondansetron HCl 8 mg tablet (Patient not taking: Reported on 02/18/2021)     ondansetron (ZOFRAN-ODT) 4 MG disintegrating tablet SMARTSIG:1 Tablet(s) By Mouth 4-5 Times Daily (Patient not taking: Reported on 02/18/2021)     No current facility-administered medications on file prior to visit.     1. Pain and swelling of lower leg, unspecified laterality Recommend  I have reviewed my previous  discussion with the patient regarding  varicose veins and why they cause symptoms. Patient will continue  wearing graduated compression stockings class 1 on a daily basis, beginning first thing in the morning and removing them in the evening.  The patient is CEAP [id number]   In addition, behavioral modification including elevation during the day was again discussed and this will continue.  The patient has utilized over the counter pain medications and has been exercising as tolerated with her active Myalgic encephalomyelitis syndrome.  However, at this time conservative therapy has not alleviated the patient's symptoms of leg pain and swelling  Recommend: laser ablation of the right and left great saphenous veins to eliminate the symptoms of pain and swelling of the lower extremities caused by the severe superficial venous reflux disease.     2.Myalgic encephalomyelitis syndrome Continue Myalgic Encephalomylitis medications as already ordered and reviewed, no changes at this time.   3. Malignant neoplasm of upper-outer quadrant of right breast in female, estrogen receptor positive ([place]) Continue breast cancer medications as already ordered, these medications have been reviewed and there are no changes at this time      4. 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 Patient was made aware that the source of her pain while standing and walking may be a result of these cysts. The laser procedure will not alleviate any symptoms related to her knee pain or her ability to walk with any distance. The laser procedure is intended to alleviate any pain in her legs due to severe superficial venous reflux disease.     There are no Patient Instructions on file for this visit. No follow-ups on file.   Drema Pry, NP

## 2022-05-31 ENCOUNTER — Telehealth (INDEPENDENT_AMBULATORY_CARE_PROVIDER_SITE_OTHER): Payer: Self-pay | Admitting: Vascular Surgery

## 2022-05-31 NOTE — Telephone Encounter (Signed)
Patient LVM stating she has an appointment with Dr. Melida Gimenez at [facility] and they need a copy of the imaging sent to his office through [place] before her appointment tomorrow.  Please advise.

## 2022-06-01 DIAGNOSIS — M7121 Synovial cyst of popliteal space [Baker], right knee: Secondary | ICD-10-CM | POA: Diagnosis not present

## 2022-06-01 DIAGNOSIS — M7122 Synovial cyst of popliteal space [Baker], left knee: Secondary | ICD-10-CM | POA: Diagnosis not present

## 2022-06-15 DIAGNOSIS — H26493 Other secondary cataract, bilateral: Secondary | ICD-10-CM | POA: Diagnosis not present

## 2022-06-16 DIAGNOSIS — M7122 Synovial cyst of popliteal space [Baker], left knee: Secondary | ICD-10-CM | POA: Diagnosis not present

## 2022-06-16 DIAGNOSIS — M7121 Synovial cyst of popliteal space [Baker], right knee: Secondary | ICD-10-CM | POA: Diagnosis not present

## 2022-07-06 DIAGNOSIS — M2550 Pain in unspecified joint: Secondary | ICD-10-CM | POA: Diagnosis not present

## 2022-07-06 DIAGNOSIS — G9332 Myalgic encephalomyelitis/chronic fatigue syndrome: Secondary | ICD-10-CM | POA: Diagnosis not present

## 2022-07-06 DIAGNOSIS — R202 Paresthesia of skin: Secondary | ICD-10-CM | POA: Diagnosis not present

## 2022-07-06 DIAGNOSIS — R600 Localized edema: Secondary | ICD-10-CM | POA: Diagnosis not present

## 2022-07-18 ENCOUNTER — Other Ambulatory Visit: Payer: Self-pay | Admitting: Otolaryngology

## 2022-07-18 ENCOUNTER — Encounter: Payer: Self-pay | Admitting: Otolaryngology

## 2022-07-18 DIAGNOSIS — H93A1 Pulsatile tinnitus, right ear: Secondary | ICD-10-CM

## 2022-08-03 DIAGNOSIS — J301 Allergic rhinitis due to pollen: Secondary | ICD-10-CM | POA: Diagnosis not present

## 2022-08-03 DIAGNOSIS — J4520 Mild intermittent asthma, uncomplicated: Secondary | ICD-10-CM | POA: Diagnosis not present

## 2022-08-16 ENCOUNTER — Ambulatory Visit
Admission: RE | Admit: 2022-08-16 | Discharge: 2022-08-16 | Disposition: A | Discharge status: Home / Self Care | Payer: BC Managed Care – PPO | Source: Ambulatory Visit | Attending: Otolaryngology | Admitting: Otolaryngology

## 2022-08-16 DIAGNOSIS — H93A1 Pulsatile tinnitus, right ear: Secondary | ICD-10-CM | POA: Diagnosis not present

## 2022-08-16 DIAGNOSIS — I6782 Cerebral ischemia: Secondary | ICD-10-CM | POA: Diagnosis not present

## 2022-08-16 MED ORDER — GADOBENATE DIMEGLUMINE 529 MG/ML IV SOLN
9.0000 mL | Freq: Once | INTRAVENOUS | Status: AC | PRN
  Administered 2022-08-16: 9 mL via INTRAVENOUS

## 2022-08-22 DIAGNOSIS — I83813 Varicose veins of bilateral lower extremities with pain: Secondary | ICD-10-CM | POA: Diagnosis not present

## 2022-08-22 DIAGNOSIS — Z9109 Other allergy status, other than to drugs and biological substances: Secondary | ICD-10-CM | POA: Diagnosis not present

## 2022-08-22 DIAGNOSIS — Z886 Allergy status to analgesic agent status: Secondary | ICD-10-CM | POA: Diagnosis not present

## 2022-08-22 DIAGNOSIS — C50411 Malignant neoplasm of upper-outer quadrant of right female breast: Secondary | ICD-10-CM | POA: Diagnosis not present

## 2022-08-22 DIAGNOSIS — J45909 Unspecified asthma, uncomplicated: Secondary | ICD-10-CM | POA: Diagnosis not present

## 2022-08-22 DIAGNOSIS — Z87891 Personal history of nicotine dependence: Secondary | ICD-10-CM | POA: Diagnosis not present

## 2022-08-22 DIAGNOSIS — Z7951 Long term (current) use of inhaled steroids: Secondary | ICD-10-CM | POA: Diagnosis not present

## 2022-08-22 DIAGNOSIS — Z923 Personal history of irradiation: Secondary | ICD-10-CM | POA: Diagnosis not present

## 2022-08-22 DIAGNOSIS — Z170 Estrogen receptor positive status [ER+]: Secondary | ICD-10-CM | POA: Diagnosis not present

## 2022-08-22 DIAGNOSIS — Z79899 Other long term (current) drug therapy: Secondary | ICD-10-CM | POA: Diagnosis not present

## 2022-08-22 DIAGNOSIS — Z8616 Personal history of COVID-19: Secondary | ICD-10-CM | POA: Diagnosis not present

## 2022-08-22 DIAGNOSIS — Z79811 Long term (current) use of aromatase inhibitors: Secondary | ICD-10-CM | POA: Diagnosis not present

## 2022-08-30 DIAGNOSIS — H26493 Other secondary cataract, bilateral: Secondary | ICD-10-CM | POA: Diagnosis not present

## 2022-08-30 DIAGNOSIS — M170 Bilateral primary osteoarthritis of knee: Secondary | ICD-10-CM | POA: Diagnosis not present

## 2022-09-19 DIAGNOSIS — M179 Osteoarthritis of knee, unspecified: Secondary | ICD-10-CM | POA: Diagnosis not present

## 2022-09-19 DIAGNOSIS — R262 Difficulty in walking, not elsewhere classified: Secondary | ICD-10-CM | POA: Diagnosis not present

## 2022-09-19 DIAGNOSIS — M25561 Pain in right knee: Secondary | ICD-10-CM | POA: Diagnosis not present

## 2022-09-19 DIAGNOSIS — M25562 Pain in left knee: Secondary | ICD-10-CM | POA: Diagnosis not present

## 2022-09-21 DIAGNOSIS — M25562 Pain in left knee: Secondary | ICD-10-CM | POA: Diagnosis not present

## 2022-09-21 DIAGNOSIS — M179 Osteoarthritis of knee, unspecified: Secondary | ICD-10-CM | POA: Diagnosis not present

## 2022-09-21 DIAGNOSIS — M25561 Pain in right knee: Secondary | ICD-10-CM | POA: Diagnosis not present

## 2022-09-21 DIAGNOSIS — R262 Difficulty in walking, not elsewhere classified: Secondary | ICD-10-CM | POA: Diagnosis not present

## 2022-09-30 DIAGNOSIS — M25562 Pain in left knee: Secondary | ICD-10-CM | POA: Diagnosis not present

## 2022-09-30 DIAGNOSIS — M179 Osteoarthritis of knee, unspecified: Secondary | ICD-10-CM | POA: Diagnosis not present

## 2022-09-30 DIAGNOSIS — M25561 Pain in right knee: Secondary | ICD-10-CM | POA: Diagnosis not present

## 2022-09-30 DIAGNOSIS — R262 Difficulty in walking, not elsewhere classified: Secondary | ICD-10-CM | POA: Diagnosis not present

## 2022-10-04 DIAGNOSIS — M179 Osteoarthritis of knee, unspecified: Secondary | ICD-10-CM | POA: Diagnosis not present

## 2022-10-04 DIAGNOSIS — M25562 Pain in left knee: Secondary | ICD-10-CM | POA: Diagnosis not present

## 2022-10-04 DIAGNOSIS — R262 Difficulty in walking, not elsewhere classified: Secondary | ICD-10-CM | POA: Diagnosis not present

## 2022-10-04 DIAGNOSIS — M25561 Pain in right knee: Secondary | ICD-10-CM | POA: Diagnosis not present

## 2022-10-20 DIAGNOSIS — Z170 Estrogen receptor positive status [ER+]: Secondary | ICD-10-CM | POA: Diagnosis not present

## 2022-10-20 DIAGNOSIS — C50811 Malignant neoplasm of overlapping sites of right female breast: Secondary | ICD-10-CM | POA: Diagnosis not present

## 2022-10-21 DIAGNOSIS — L578 Other skin changes due to chronic exposure to nonionizing radiation: Secondary | ICD-10-CM | POA: Diagnosis not present

## 2022-10-21 DIAGNOSIS — L910 Hypertrophic scar: Secondary | ICD-10-CM | POA: Diagnosis not present

## 2022-11-09 ENCOUNTER — Telehealth (INDEPENDENT_AMBULATORY_CARE_PROVIDER_SITE_OTHER): Payer: Self-pay | Admitting: Nurse Practitioner

## 2022-11-09 DIAGNOSIS — I83813 Varicose veins of bilateral lower extremities with pain: Secondary | ICD-10-CM | POA: Diagnosis not present

## 2022-11-09 DIAGNOSIS — M174 Other bilateral secondary osteoarthritis of knee: Secondary | ICD-10-CM | POA: Diagnosis not present

## 2022-11-09 DIAGNOSIS — M2550 Pain in unspecified joint: Secondary | ICD-10-CM | POA: Diagnosis not present

## 2022-11-09 DIAGNOSIS — G9332 Myalgic encephalomyelitis/chronic fatigue syndrome: Secondary | ICD-10-CM | POA: Diagnosis not present

## 2022-11-09 NOTE — Telephone Encounter (Signed)
Pt returned my call and stated that she was not interested in continuing with a laser ablation. Nothing further is needed at this time.

## 2022-11-09 NOTE — Telephone Encounter (Signed)
LVM for pt to advise if she is still interested in doing laser(s).

## 2022-12-02 DIAGNOSIS — M170 Bilateral primary osteoarthritis of knee: Secondary | ICD-10-CM | POA: Diagnosis not present

## 2023-01-10 DIAGNOSIS — M7122 Synovial cyst of popliteal space [Baker], left knee: Secondary | ICD-10-CM | POA: Diagnosis not present

## 2023-01-10 DIAGNOSIS — M7121 Synovial cyst of popliteal space [Baker], right knee: Secondary | ICD-10-CM | POA: Diagnosis not present

## 2023-02-01 DIAGNOSIS — J4520 Mild intermittent asthma, uncomplicated: Secondary | ICD-10-CM | POA: Diagnosis not present

## 2023-02-01 DIAGNOSIS — Z01419 Encounter for gynecological examination (general) (routine) without abnormal findings: Secondary | ICD-10-CM | POA: Diagnosis not present

## 2023-02-01 DIAGNOSIS — J301 Allergic rhinitis due to pollen: Secondary | ICD-10-CM | POA: Diagnosis not present

## 2023-02-20 DIAGNOSIS — Z79899 Other long term (current) drug therapy: Secondary | ICD-10-CM | POA: Diagnosis not present

## 2023-02-20 DIAGNOSIS — N80109 Endometriosis of ovary, unspecified side, unspecified depth: Secondary | ICD-10-CM | POA: Diagnosis not present

## 2023-02-20 DIAGNOSIS — F419 Anxiety disorder, unspecified: Secondary | ICD-10-CM | POA: Diagnosis not present

## 2023-02-20 DIAGNOSIS — M79606 Pain in leg, unspecified: Secondary | ICD-10-CM | POA: Diagnosis not present

## 2023-02-20 DIAGNOSIS — C50411 Malignant neoplasm of upper-outer quadrant of right female breast: Secondary | ICD-10-CM | POA: Diagnosis not present

## 2023-02-20 DIAGNOSIS — Z780 Asymptomatic menopausal state: Secondary | ICD-10-CM | POA: Diagnosis not present

## 2023-02-20 DIAGNOSIS — D472 Monoclonal gammopathy: Secondary | ICD-10-CM | POA: Diagnosis not present

## 2023-02-20 DIAGNOSIS — G9332 Myalgic encephalomyelitis/chronic fatigue syndrome: Secondary | ICD-10-CM | POA: Diagnosis not present

## 2023-02-20 DIAGNOSIS — K769 Liver disease, unspecified: Secondary | ICD-10-CM | POA: Diagnosis not present

## 2023-02-20 DIAGNOSIS — J45909 Unspecified asthma, uncomplicated: Secondary | ICD-10-CM | POA: Diagnosis not present

## 2023-02-20 DIAGNOSIS — H547 Unspecified visual loss: Secondary | ICD-10-CM | POA: Diagnosis not present

## 2023-02-20 DIAGNOSIS — Z90710 Acquired absence of both cervix and uterus: Secondary | ICD-10-CM | POA: Diagnosis not present

## 2023-02-20 DIAGNOSIS — Z79811 Long term (current) use of aromatase inhibitors: Secondary | ICD-10-CM | POA: Diagnosis not present

## 2023-02-20 DIAGNOSIS — Z170 Estrogen receptor positive status [ER+]: Secondary | ICD-10-CM | POA: Diagnosis not present

## 2023-02-20 DIAGNOSIS — Z803 Family history of malignant neoplasm of breast: Secondary | ICD-10-CM | POA: Diagnosis not present

## 2023-02-20 DIAGNOSIS — Z7951 Long term (current) use of inhaled steroids: Secondary | ICD-10-CM | POA: Diagnosis not present

## 2023-03-14 DIAGNOSIS — Z808 Family history of malignant neoplasm of other organs or systems: Secondary | ICD-10-CM | POA: Diagnosis not present

## 2023-03-14 DIAGNOSIS — Z803 Family history of malignant neoplasm of breast: Secondary | ICD-10-CM | POA: Diagnosis not present

## 2023-03-14 DIAGNOSIS — D472 Monoclonal gammopathy: Secondary | ICD-10-CM | POA: Diagnosis not present

## 2023-03-14 DIAGNOSIS — G9332 Myalgic encephalomyelitis/chronic fatigue syndrome: Secondary | ICD-10-CM | POA: Diagnosis not present

## 2023-03-14 DIAGNOSIS — R5383 Other fatigue: Secondary | ICD-10-CM | POA: Diagnosis not present

## 2023-03-14 DIAGNOSIS — Z9011 Acquired absence of right breast and nipple: Secondary | ICD-10-CM | POA: Diagnosis not present

## 2023-03-24 DIAGNOSIS — M6788 Other specified disorders of synovium and tendon, other site: Secondary | ICD-10-CM | POA: Diagnosis not present

## 2023-05-02 DIAGNOSIS — R5382 Chronic fatigue, unspecified: Secondary | ICD-10-CM | POA: Diagnosis not present

## 2023-05-02 DIAGNOSIS — R5381 Other malaise: Secondary | ICD-10-CM | POA: Diagnosis not present

## 2023-05-02 DIAGNOSIS — Z0000 Encounter for general adult medical examination without abnormal findings: Secondary | ICD-10-CM | POA: Diagnosis not present

## 2023-05-02 DIAGNOSIS — M2550 Pain in unspecified joint: Secondary | ICD-10-CM | POA: Diagnosis not present

## 2023-05-02 DIAGNOSIS — Z131 Encounter for screening for diabetes mellitus: Secondary | ICD-10-CM | POA: Diagnosis not present

## 2023-05-02 DIAGNOSIS — Z13220 Encounter for screening for lipoid disorders: Secondary | ICD-10-CM | POA: Diagnosis not present

## 2023-05-02 DIAGNOSIS — Z79899 Other long term (current) drug therapy: Secondary | ICD-10-CM | POA: Diagnosis not present

## 2023-05-02 DIAGNOSIS — M47816 Spondylosis without myelopathy or radiculopathy, lumbar region: Secondary | ICD-10-CM | POA: Diagnosis not present

## 2023-05-05 DIAGNOSIS — G9332 Myalgic encephalomyelitis/chronic fatigue syndrome: Secondary | ICD-10-CM | POA: Diagnosis not present

## 2023-05-30 DIAGNOSIS — J4520 Mild intermittent asthma, uncomplicated: Secondary | ICD-10-CM | POA: Diagnosis not present

## 2023-05-30 DIAGNOSIS — J302 Other seasonal allergic rhinitis: Secondary | ICD-10-CM | POA: Diagnosis not present

## 2023-05-30 DIAGNOSIS — J209 Acute bronchitis, unspecified: Secondary | ICD-10-CM | POA: Diagnosis not present

## 2023-06-15 DIAGNOSIS — M6788 Other specified disorders of synovium and tendon, other site: Secondary | ICD-10-CM | POA: Diagnosis not present

## 2023-06-20 DIAGNOSIS — M170 Bilateral primary osteoarthritis of knee: Secondary | ICD-10-CM | POA: Diagnosis not present

## 2023-06-20 DIAGNOSIS — M7121 Synovial cyst of popliteal space [Baker], right knee: Secondary | ICD-10-CM | POA: Diagnosis not present

## 2023-06-20 DIAGNOSIS — M7122 Synovial cyst of popliteal space [Baker], left knee: Secondary | ICD-10-CM | POA: Diagnosis not present

## 2023-06-22 DIAGNOSIS — M6788 Other specified disorders of synovium and tendon, other site: Secondary | ICD-10-CM | POA: Diagnosis not present

## 2024-02-15 ENCOUNTER — Other Ambulatory Visit: Payer: Self-pay | Admitting: Specialist

## 2024-02-15 ENCOUNTER — Other Ambulatory Visit
Admission: RE | Admit: 2024-02-15 | Discharge: 2024-02-15 | Disposition: A | Discharge status: Home / Self Care | Source: Ambulatory Visit | Attending: Specialist | Admitting: Specialist

## 2024-02-15 ENCOUNTER — Ambulatory Visit
Admission: RE | Admit: 2024-02-15 | Discharge: 2024-02-15 | Disposition: A | Discharge status: Home / Self Care | Source: Ambulatory Visit | Attending: Specialist | Admitting: Specialist

## 2024-02-15 DIAGNOSIS — R079 Chest pain, unspecified: Secondary | ICD-10-CM | POA: Diagnosis present

## 2024-02-15 LAB — D-DIMER, QUANTITATIVE: D-Dimer, Quant: 1.33 ug{FEU}/mL — ABNORMAL HIGH (ref 0.00–0.50)

## 2024-02-15 MED ORDER — IOHEXOL 350 MG/ML SOLN
75.0000 mL | Freq: Once | INTRAVENOUS | Status: AC | PRN
  Administered 2024-02-15: 75 mL via INTRAVENOUS
